# Patient Record
Sex: Female | Born: 1960 | Race: Black or African American | Hispanic: No | Marital: Married | Smoking: Former smoker
Health system: Southern US, Community
[De-identification: ages and names within clinical notes are randomized; demographics above are authoritative.]

---

## 2000-07-11 HISTORY — PX: BREAST BIOPSY: SHX20

## 2013-10-08 ENCOUNTER — Ambulatory Visit: Payer: Self-pay

## 2014-10-16 ENCOUNTER — Ambulatory Visit: Payer: Medicaid Other | Admitting: Orthopedic Surgery

## 2014-10-20 ENCOUNTER — Ambulatory Visit: Payer: Medicaid Other | Admitting: Orthopedic Surgery

## 2014-10-21 ENCOUNTER — Encounter: Payer: Self-pay | Admitting: Orthopedic Surgery

## 2014-12-15 ENCOUNTER — Ambulatory Visit: Payer: Medicaid Other | Admitting: Orthopedic Surgery

## 2015-10-29 DIAGNOSIS — A6000 Herpesviral infection of urogenital system, unspecified: Secondary | ICD-10-CM | POA: Insufficient documentation

## 2015-10-29 DIAGNOSIS — M549 Dorsalgia, unspecified: Secondary | ICD-10-CM | POA: Insufficient documentation

## 2015-10-29 DIAGNOSIS — I10 Essential (primary) hypertension: Secondary | ICD-10-CM | POA: Insufficient documentation

## 2015-11-02 ENCOUNTER — Other Ambulatory Visit: Payer: Self-pay | Admitting: Family Medicine

## 2015-11-02 DIAGNOSIS — M542 Cervicalgia: Secondary | ICD-10-CM

## 2015-11-02 DIAGNOSIS — Z1231 Encounter for screening mammogram for malignant neoplasm of breast: Secondary | ICD-10-CM

## 2015-11-02 DIAGNOSIS — M549 Dorsalgia, unspecified: Secondary | ICD-10-CM

## 2015-11-09 ENCOUNTER — Ambulatory Visit
Admission: RE | Admit: 2015-11-09 | Discharge: 2015-11-09 | Disposition: A | Discharge status: Home / Self Care | Payer: Medicaid Other | Source: Ambulatory Visit | Attending: Family Medicine | Admitting: Family Medicine

## 2015-11-09 DIAGNOSIS — Z1231 Encounter for screening mammogram for malignant neoplasm of breast: Secondary | ICD-10-CM | POA: Insufficient documentation

## 2015-11-10 ENCOUNTER — Ambulatory Visit
Admission: RE | Admit: 2015-11-10 | Discharge: 2015-11-10 | Disposition: A | Discharge status: Home / Self Care | Payer: Medicaid Other | Source: Ambulatory Visit | Attending: Family Medicine | Admitting: Family Medicine

## 2015-11-10 DIAGNOSIS — M542 Cervicalgia: Secondary | ICD-10-CM | POA: Insufficient documentation

## 2015-11-10 DIAGNOSIS — M549 Dorsalgia, unspecified: Secondary | ICD-10-CM | POA: Insufficient documentation

## 2015-11-26 DIAGNOSIS — J302 Other seasonal allergic rhinitis: Secondary | ICD-10-CM | POA: Insufficient documentation

## 2015-12-01 ENCOUNTER — Other Ambulatory Visit: Payer: Self-pay | Admitting: Family Medicine

## 2015-12-01 DIAGNOSIS — R928 Other abnormal and inconclusive findings on diagnostic imaging of breast: Secondary | ICD-10-CM

## 2015-12-11 ENCOUNTER — Ambulatory Visit: Payer: Medicaid Other

## 2015-12-11 ENCOUNTER — Other Ambulatory Visit: Payer: Medicaid Other

## 2015-12-16 ENCOUNTER — Ambulatory Visit: Payer: Medicaid Other | Attending: Family Medicine | Admitting: Physical Therapy

## 2015-12-16 ENCOUNTER — Encounter: Payer: Self-pay | Admitting: Physical Therapy

## 2015-12-16 DIAGNOSIS — M542 Cervicalgia: Secondary | ICD-10-CM | POA: Diagnosis present
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
## 2015-12-23 ENCOUNTER — Ambulatory Visit: Payer: Medicaid Other

## 2015-12-23 ENCOUNTER — Other Ambulatory Visit: Payer: Medicaid Other

## 2016-01-07 ENCOUNTER — Other Ambulatory Visit: Payer: Medicaid Other

## 2016-01-07 ENCOUNTER — Ambulatory Visit: Admission: RE | Admit: 2016-01-07 | Payer: Medicaid Other | Source: Ambulatory Visit

## 2016-01-22 ENCOUNTER — Ambulatory Visit: Payer: Medicaid Other | Admitting: Sports Medicine

## 2016-02-23 DIAGNOSIS — R32 Unspecified urinary incontinence: Secondary | ICD-10-CM | POA: Insufficient documentation

## 2016-11-17 ENCOUNTER — Encounter: Payer: Medicaid Other | Admitting: Podiatry

## 2016-11-17 NOTE — Progress Notes (Signed)
This encounter was created in error - please disregard.

## 2016-11-29 ENCOUNTER — Encounter: Payer: Medicaid Other | Admitting: Podiatry

## 2016-12-09 DIAGNOSIS — Z113 Encounter for screening for infections with a predominantly sexual mode of transmission: Secondary | ICD-10-CM | POA: Insufficient documentation

## 2017-01-16 NOTE — Progress Notes (Signed)
This encounter was created in error - please disregard.

## 2017-03-02 DIAGNOSIS — F439 Reaction to severe stress, unspecified: Secondary | ICD-10-CM | POA: Insufficient documentation

## 2017-03-29 ENCOUNTER — Other Ambulatory Visit: Payer: Self-pay | Admitting: Family Medicine

## 2017-03-29 DIAGNOSIS — Z1231 Encounter for screening mammogram for malignant neoplasm of breast: Secondary | ICD-10-CM

## 2017-03-29 DIAGNOSIS — R928 Other abnormal and inconclusive findings on diagnostic imaging of breast: Secondary | ICD-10-CM

## 2017-04-11 ENCOUNTER — Other Ambulatory Visit: Payer: Self-pay

## 2017-04-11 ENCOUNTER — Ambulatory Visit: Admission: RE | Admit: 2017-04-11 | Payer: Self-pay | Source: Ambulatory Visit

## 2017-04-11 ENCOUNTER — Ambulatory Visit: Payer: Self-pay

## 2017-05-02 ENCOUNTER — Ambulatory Visit: Payer: Medicaid Other

## 2017-05-31 ENCOUNTER — Ambulatory Visit: Payer: Medicaid Other

## 2017-05-31 ENCOUNTER — Ambulatory Visit
Admission: RE | Admit: 2017-05-31 | Discharge: 2017-05-31 | Disposition: A | Discharge status: Home / Self Care | Payer: Medicaid Other | Source: Ambulatory Visit | Attending: Family Medicine | Admitting: Family Medicine

## 2017-05-31 DIAGNOSIS — Z1231 Encounter for screening mammogram for malignant neoplasm of breast: Secondary | ICD-10-CM

## 2017-05-31 DIAGNOSIS — R928 Other abnormal and inconclusive findings on diagnostic imaging of breast: Secondary | ICD-10-CM

## 2017-06-09 ENCOUNTER — Other Ambulatory Visit: Payer: Medicaid Other

## 2017-07-14 ENCOUNTER — Other Ambulatory Visit: Payer: Medicaid Other

## 2017-08-22 ENCOUNTER — Other Ambulatory Visit: Payer: Self-pay | Admitting: Pediatrics

## 2017-08-22 DIAGNOSIS — J449 Chronic obstructive pulmonary disease, unspecified: Secondary | ICD-10-CM

## 2017-09-05 ENCOUNTER — Other Ambulatory Visit: Payer: Self-pay | Admitting: Pediatrics

## 2017-09-05 ENCOUNTER — Ambulatory Visit
Admission: RE | Admit: 2017-09-05 | Discharge: 2017-09-05 | Disposition: A | Discharge status: Home / Self Care | Payer: Disability Insurance | Source: Ambulatory Visit | Attending: Pediatrics | Admitting: Pediatrics

## 2017-09-05 ENCOUNTER — Ambulatory Visit (HOSPITAL_COMMUNITY): Payer: Disability Insurance

## 2017-09-05 DIAGNOSIS — I10 Essential (primary) hypertension: Secondary | ICD-10-CM | POA: Diagnosis present

## 2017-09-05 DIAGNOSIS — J449 Chronic obstructive pulmonary disease, unspecified: Secondary | ICD-10-CM | POA: Diagnosis not present

## 2017-09-05 DIAGNOSIS — M1712 Unilateral primary osteoarthritis, left knee: Secondary | ICD-10-CM | POA: Insufficient documentation

## 2017-09-05 DIAGNOSIS — M1990 Unspecified osteoarthritis, unspecified site: Secondary | ICD-10-CM

## 2017-09-29 ENCOUNTER — Ambulatory Visit
Admission: RE | Admit: 2017-09-29 | Discharge: 2017-09-29 | Disposition: A | Discharge status: Home / Self Care | Payer: Medicaid Other | Source: Ambulatory Visit | Attending: Family Medicine | Admitting: Family Medicine

## 2017-09-29 DIAGNOSIS — R928 Other abnormal and inconclusive findings on diagnostic imaging of breast: Secondary | ICD-10-CM

## 2017-09-29 DIAGNOSIS — Z1231 Encounter for screening mammogram for malignant neoplasm of breast: Secondary | ICD-10-CM

## 2018-04-02 DIAGNOSIS — Z91120 Patient's intentional underdosing of medication regimen due to financial hardship: Secondary | ICD-10-CM | POA: Insufficient documentation

## 2018-04-25 ENCOUNTER — Other Ambulatory Visit: Payer: Self-pay | Admitting: Family Medicine

## 2018-04-25 DIAGNOSIS — R928 Other abnormal and inconclusive findings on diagnostic imaging of breast: Secondary | ICD-10-CM

## 2018-05-07 ENCOUNTER — Other Ambulatory Visit: Payer: Medicaid Other

## 2018-05-14 ENCOUNTER — Ambulatory Visit
Admission: RE | Admit: 2018-05-14 | Discharge: 2018-05-14 | Disposition: A | Discharge status: Home / Self Care | Payer: Medicaid Other | Source: Ambulatory Visit | Attending: Family Medicine | Admitting: Family Medicine

## 2018-05-14 ENCOUNTER — Encounter: Payer: Self-pay | Admitting: Radiology

## 2018-05-14 DIAGNOSIS — R928 Other abnormal and inconclusive findings on diagnostic imaging of breast: Secondary | ICD-10-CM

## 2018-06-06 DIAGNOSIS — Z0000 Encounter for general adult medical examination without abnormal findings: Secondary | ICD-10-CM | POA: Insufficient documentation

## 2018-08-08 DIAGNOSIS — N3946 Mixed incontinence: Secondary | ICD-10-CM | POA: Insufficient documentation

## 2018-08-10 ENCOUNTER — Other Ambulatory Visit: Payer: Self-pay | Admitting: Family Medicine

## 2018-08-10 DIAGNOSIS — R928 Other abnormal and inconclusive findings on diagnostic imaging of breast: Secondary | ICD-10-CM

## 2018-10-01 ENCOUNTER — Other Ambulatory Visit: Payer: Medicaid Other

## 2018-10-04 ENCOUNTER — Other Ambulatory Visit: Payer: Medicaid Other

## 2018-11-12 DIAGNOSIS — Z87891 Personal history of nicotine dependence: Secondary | ICD-10-CM | POA: Insufficient documentation

## 2018-11-12 DIAGNOSIS — Z9181 History of falling: Secondary | ICD-10-CM | POA: Insufficient documentation

## 2018-12-05 DIAGNOSIS — R928 Other abnormal and inconclusive findings on diagnostic imaging of breast: Secondary | ICD-10-CM | POA: Insufficient documentation

## 2018-12-05 DIAGNOSIS — M25569 Pain in unspecified knee: Secondary | ICD-10-CM | POA: Insufficient documentation

## 2019-01-03 DIAGNOSIS — L0233 Carbuncle of buttock: Secondary | ICD-10-CM | POA: Insufficient documentation

## 2019-01-22 DIAGNOSIS — L729 Follicular cyst of the skin and subcutaneous tissue, unspecified: Secondary | ICD-10-CM | POA: Insufficient documentation

## 2019-05-01 DIAGNOSIS — L723 Sebaceous cyst: Secondary | ICD-10-CM | POA: Insufficient documentation

## 2019-05-06 ENCOUNTER — Other Ambulatory Visit: Payer: Self-pay | Admitting: Internal Medicine

## 2019-05-06 ENCOUNTER — Ambulatory Visit
Admission: RE | Admit: 2019-05-06 | Discharge: 2019-05-06 | Disposition: A | Discharge status: Home / Self Care | Payer: Disability Insurance | Attending: Internal Medicine | Admitting: Internal Medicine

## 2019-05-06 ENCOUNTER — Ambulatory Visit
Admission: RE | Admit: 2019-05-06 | Discharge: 2019-05-06 | Disposition: A | Discharge status: Home / Self Care | Payer: Disability Insurance | Source: Ambulatory Visit | Attending: Internal Medicine | Admitting: Internal Medicine

## 2019-05-06 DIAGNOSIS — M1712 Unilateral primary osteoarthritis, left knee: Secondary | ICD-10-CM | POA: Diagnosis present

## 2019-05-06 DIAGNOSIS — M1711 Unilateral primary osteoarthritis, right knee: Secondary | ICD-10-CM | POA: Diagnosis present

## 2019-08-22 DIAGNOSIS — R519 Headache, unspecified: Secondary | ICD-10-CM | POA: Insufficient documentation

## 2019-08-22 DIAGNOSIS — M7918 Myalgia, other site: Secondary | ICD-10-CM | POA: Insufficient documentation

## 2019-09-06 DIAGNOSIS — W19XXXA Unspecified fall, initial encounter: Secondary | ICD-10-CM | POA: Insufficient documentation

## 2019-09-23 ENCOUNTER — Other Ambulatory Visit: Payer: Self-pay | Admitting: Family Medicine

## 2019-09-23 DIAGNOSIS — Z1231 Encounter for screening mammogram for malignant neoplasm of breast: Secondary | ICD-10-CM

## 2019-10-25 DIAGNOSIS — R599 Enlarged lymph nodes, unspecified: Secondary | ICD-10-CM | POA: Insufficient documentation

## 2019-11-06 DIAGNOSIS — M25511 Pain in right shoulder: Secondary | ICD-10-CM | POA: Insufficient documentation

## 2019-11-12 ENCOUNTER — Other Ambulatory Visit: Payer: Self-pay | Admitting: Family Medicine

## 2019-11-12 DIAGNOSIS — S060X0A Concussion without loss of consciousness, initial encounter: Secondary | ICD-10-CM

## 2019-11-21 ENCOUNTER — Ambulatory Visit
Admission: RE | Admit: 2019-11-21 | Discharge: 2019-11-21 | Disposition: A | Discharge status: Home / Self Care | Payer: Medicaid Other | Source: Ambulatory Visit | Attending: Family Medicine | Admitting: Family Medicine

## 2019-11-21 ENCOUNTER — Other Ambulatory Visit: Payer: Self-pay

## 2019-11-21 DIAGNOSIS — S060X0A Concussion without loss of consciousness, initial encounter: Secondary | ICD-10-CM | POA: Insufficient documentation

## 2019-11-25 ENCOUNTER — Other Ambulatory Visit: Payer: Self-pay

## 2019-11-25 ENCOUNTER — Encounter: Payer: Self-pay | Admitting: Urology

## 2019-11-25 ENCOUNTER — Ambulatory Visit (INDEPENDENT_AMBULATORY_CARE_PROVIDER_SITE_OTHER): Payer: Medicaid Other | Admitting: Urology

## 2019-11-25 VITALS — BP 168/85 | HR 88 | Ht 62.0 in | Wt 163.0 lb

## 2019-11-25 DIAGNOSIS — N3946 Mixed incontinence: Secondary | ICD-10-CM | POA: Diagnosis not present

## 2019-11-25 DIAGNOSIS — R32 Unspecified urinary incontinence: Secondary | ICD-10-CM | POA: Diagnosis not present

## 2019-11-25 LAB — URINALYSIS, COMPLETE
Bilirubin, UA: NEGATIVE
Glucose, UA: NEGATIVE
Ketones, UA: NEGATIVE
Leukocytes,UA: NEGATIVE
Nitrite, UA: NEGATIVE
Protein,UA: NEGATIVE
RBC, UA: NEGATIVE
Specific Gravity, UA: >1.03 — ABNORMAL HIGH (ref 1.005–1.030)
Urobilinogen, Ur: 0.2 mg/dL (ref 0.2–1.0)
pH, UA: 5 (ref 5.0–7.5)

## 2019-11-25 LAB — MICROSCOPIC EXAMINATION: Epithelial Cells (non renal): >10 /hpf — AB (ref 0–10)

## 2019-11-25 LAB — BLADDER SCAN AMB NON-IMAGING: Scan Result: 20

## 2019-11-25 MED ORDER — MIRABEGRON ER 50 MG PO TB24: 50.0000 mg | ORAL_TABLET | Freq: Every day | ORAL | 11 refills | Status: AC

## 2019-11-25 NOTE — Progress Notes (Signed)
11/25/2019 10:28 AM   Lacie Scotts Aug 22, 1960 [id number]  Referring provider: Emogene Morgan, MD [address] [place],  [place] [address]  Chief Complaint  Patient presents with  . Urinary Incontinence    HPI: I was consulted to assess the patient's urinary incontinence.  Some of the details were a little bit challenging but she appears to be voiding approximately every 10 minutes due to urgency.  She voids small amounts frequently especially after drinking soda.  She has urge incontinence.  No bedwetting.  Might leak with bending lifting but not coughing sneezing.  She says she uses 12 pads a day moderately wet.  Things have been going on for years.  Generally gets up 4 times at night to urinate  Has had a hysterectomy I believe  No history of kidney stones previous GU surgery or bladder infections.  On pelvic examination patient had excellent support.  No stress incontinence or cystocele   PMH: No past medical history on file.  Surgical History: Past Surgical History:  Procedure Laterality Date  . BREAST BIOPSY Right 2002   neg    Home Medications:  Allergies as of 11/25/2019   Not on File     Medication List       Accurate as of Nov 25, 2019 10:28 AM. If you have any questions, ask your nurse or doctor.        amLODipine-benazepril 5-20 MG capsule Commonly known as: LOTREL amlodipine 5 mg-benazepril 20 mg capsule  TAKE 2 CAPSULES BY MOUTH AT THE SAME TIME EVERY DAY   aspirin 81 MG EC tablet aspirin 81 mg tablet,delayed release  TAKE 1 TABLET BY MOUTH DAILY FOR HEART PROTECTION   cephALEXin 500 MG capsule Commonly known as: KEFLEX cephalexin 500 mg capsule  TAKE 1 CAPSULE BY MOUTH 3 TIMES A DAY FOR 10 DAYS FOR INFECTION   Cetirizine HCl 10 MG Caps Take by mouth.   naproxen 500 MG tablet Commonly known as: NAPROSYN naproxen 500 mg tablet  TAKE 1 TABLET BY MOUTH TWICE A DAY WITH MEALS       Allergies: Not on File  Family  History: Family History  Problem Relation Age of Onset  . Breast cancer Neg Hx     Social History:  reports that she quit smoking about 4 years ago. She has a 5.00 pack-year smoking history. She has never used smokeless tobacco. She reports previous alcohol use. No history on file for drug.  ROS:                                        Physical Exam: BP (!) 168/85   Pulse 88   Ht 5\' 2"  (1.575 m)   Wt 163 lb (73.9 kg)   LMP 09/12/2017   BMI 29.81 kg/m   Constitutional:  Alert and oriented, No acute distress.  Laboratory Data: No results found for: WBC, HGB, HCT, MCV, PLT  No results found for: CREATININE  No results found for: PSA  No results found for: TESTOSTERONE  No results found for: HGBA1C  Urinalysis No results found for: COLORURINE, APPEARANCEUR, LABSPEC, PHURINE, GLUCOSEU, HGBUR, BILIRUBINUR, KETONESUR, PROTEINUR, UROBILINOGEN, NITRITE, LEUKOCYTESUR  Pertinent Imaging:  Urinalysis abnormal.  Culture sent.  Chart reviewed.  Prescription given   Assessment & Plan: Patient has mixed incontinence but primarily urge incontinence and small-volume frequency that is impressive.  I will start her on  medication have her come back for pelvic examination and cystoscopy.  I will call if urine culture is positive  Reassess on Myrbetriq 50 mg samples and prescription in 6 weeks  1. Urinary incontinence, unspecified type  - Urinalysis, Complete - Bladder Scan (Post Void Residual) in office   No follow-ups on file.  Reece Packer, MD  [place] [address], [place],  [address] [phone]

## 2019-11-25 NOTE — Patient Instructions (Signed)
Cystoscopy Cystoscopy is a procedure that is used to help diagnose and sometimes treat conditions that affect the lower urinary tract. The lower urinary tract includes the bladder and the urethra. The urethra is the tube that drains urine from the bladder. Cystoscopy is done using a thin, tube-shaped instrument with a light and camera at the end (cystoscope). The cystoscope may be hard or flexible, depending on the goal of the procedure. The cystoscope is inserted through the urethra, into the bladder. Cystoscopy may be recommended if you have:  Urinary tract infections that keep coming back.  Blood in the urine (hematuria).  An inability to control when you urinate (urinary incontinence) or an overactive bladder.  Unusual cells found in a urine sample.  A blockage in the urethra, such as a urinary stone.  Painful urination.  An abnormality in the bladder found during an intravenous pyelogram (IVP) or CT scan. Cystoscopy may also be done to remove a sample of tissue to be examined under a microscope (biopsy). What are the risks? Generally, this is a safe procedure. However, problems may occur, including:  Infection.  Bleeding.  What happens during the procedure?  1. You will be given one or more of the following: ? A medicine to numb the area (local anesthetic). 2. The area around the opening of your urethra will be cleaned. 3. The cystoscope will be passed through your urethra into your bladder. 4. Germ-free (sterile) fluid will flow through the cystoscope to fill your bladder. The fluid will stretch your bladder so that your health care provider can clearly examine your bladder walls. 5. Your doctor will look at the urethra and bladder. 6. The cystoscope will be removed The procedure may vary among health care providers  What can I expect after the procedure? After the procedure, it is common to have: 1. Some soreness or pain in your abdomen and urethra. 2. Urinary symptoms.  These include: ? Mild pain or burning when you urinate. Pain should stop within a few minutes after you urinate. This may last for up to 1 week. ? A small amount of blood in your urine for several days. ? Feeling like you need to urinate but producing only a small amount of urine. Follow these instructions at home: General instructions  Return to your normal activities as told by your health care provider.   Do not drive for 24 hours if you were given a sedative during your procedure.  Watch for any blood in your urine. If the amount of blood in your urine increases, call your health care provider.  If a tissue sample was removed for testing (biopsy) during your procedure, it is up to you to get your test results. Ask your health care provider, or the department that is doing the test, when your results will be ready.  Drink enough fluid to keep your urine pale yellow.  Keep all follow-up visits as told by your health care provider. This is important. Contact a health care provider if you:  Have pain that gets worse or does not get better with medicine, especially pain when you urinate.  Have trouble urinating.  Have more blood in your urine. Get help right away if you:  Have blood clots in your urine.  Have abdominal pain.  Have a fever or chills.  Are unable to urinate. Summary  Cystoscopy is a procedure that is used to help diagnose and sometimes treat conditions that affect the lower urinary tract.  Cystoscopy is done using   a thin, tube-shaped instrument with a light and camera at the end.  After the procedure, it is common to have some soreness or pain in your abdomen and urethra.  Watch for any blood in your urine. If the amount of blood in your urine increases, call your health care provider.  If you were prescribed an antibiotic medicine, take it as told by your health care provider. Do not stop taking the antibiotic even if you start to feel better. This  information is not intended to replace advice given to you by your health care provider. Make sure you discuss any questions you have with your health care provider. Document Revised: 06/19/2018 Document Reviewed: 06/19/2018 Elsevier Patient Education  2020 Elsevier Inc[employer]   

## 2019-11-27 LAB — CULTURE, URINE COMPREHENSIVE

## 2020-01-06 ENCOUNTER — Other Ambulatory Visit: Payer: Self-pay | Admitting: Urology

## 2020-02-13 DIAGNOSIS — N760 Acute vaginitis: Secondary | ICD-10-CM | POA: Insufficient documentation

## 2020-05-06 DIAGNOSIS — M5417 Radiculopathy, lumbosacral region: Secondary | ICD-10-CM | POA: Insufficient documentation

## 2020-05-25 ENCOUNTER — Other Ambulatory Visit: Payer: Self-pay

## 2020-05-25 ENCOUNTER — Encounter: Payer: Self-pay | Admitting: Emergency Medicine

## 2020-05-25 ENCOUNTER — Emergency Department
Admission: EM | Admit: 2020-05-25 | Discharge: 2020-05-25 | Disposition: A | Discharge status: Home / Self Care | Payer: Medicaid Other | Attending: Emergency Medicine | Admitting: Emergency Medicine

## 2020-05-25 DIAGNOSIS — Y93G1 Activity, food preparation and clean up: Secondary | ICD-10-CM | POA: Insufficient documentation

## 2020-05-25 DIAGNOSIS — Y990 Civilian activity done for income or pay: Secondary | ICD-10-CM | POA: Diagnosis not present

## 2020-05-25 DIAGNOSIS — Z79899 Other long term (current) drug therapy: Secondary | ICD-10-CM | POA: Diagnosis not present

## 2020-05-25 DIAGNOSIS — S61211A Laceration without foreign body of left index finger without damage to nail, initial encounter: Secondary | ICD-10-CM | POA: Diagnosis present

## 2020-05-25 DIAGNOSIS — Y929 Unspecified place or not applicable: Secondary | ICD-10-CM | POA: Insufficient documentation

## 2020-05-25 DIAGNOSIS — Z7982 Long term (current) use of aspirin: Secondary | ICD-10-CM | POA: Diagnosis not present

## 2020-05-25 DIAGNOSIS — Z87891 Personal history of nicotine dependence: Secondary | ICD-10-CM | POA: Diagnosis not present

## 2020-05-25 DIAGNOSIS — S61411A Laceration without foreign body of right hand, initial encounter: Secondary | ICD-10-CM

## 2020-05-25 DIAGNOSIS — W25XXXA Contact with sharp glass, initial encounter: Secondary | ICD-10-CM | POA: Diagnosis not present

## 2020-05-25 DIAGNOSIS — Z23 Encounter for immunization: Secondary | ICD-10-CM | POA: Insufficient documentation

## 2020-05-25 MED ORDER — TETANUS-DIPHTH-ACELL PERTUSSIS 5-2.5-18.5 LF-MCG/0.5 IM SUSY
0.5000 mL | PREFILLED_SYRINGE | Freq: Once | INTRAMUSCULAR | Status: AC
  Administered 2020-05-25: 0.5 mL via INTRAMUSCULAR
  Filled 2020-05-25: qty 0.5

## 2020-05-25 MED ORDER — TETRACAINE HCL 0.5 % OP SOLN
2.0000 [drp] | Freq: Once | OPHTHALMIC | Status: AC
  Administered 2020-05-25: 2 [drp] via OPHTHALMIC
  Filled 2020-05-25: qty 4

## 2020-05-25 MED ORDER — LIDOCAINE HCL URETHRAL/MUCOSAL 2 % EX GEL
1.0000 "application " | Freq: Once | CUTANEOUS | Status: AC
  Administered 2020-05-25: 1 via TOPICAL
  Filled 2020-05-25: qty 5

## 2020-05-25 MED ORDER — CEPHALEXIN 500 MG PO CAPS
500.0000 mg | ORAL_CAPSULE | Freq: Once | ORAL | Status: AC
  Administered 2020-05-25: 500 mg via ORAL
  Filled 2020-05-25: qty 1

## 2020-05-25 MED ORDER — CEPHALEXIN 500 MG PO CAPS: 500.0000 mg | ORAL_CAPSULE | Freq: Three times a day (TID) | ORAL | 0 refills | Status: AC

## 2020-05-25 NOTE — ED Provider Notes (Signed)
[facility] Emergency Department Provider Note ____________________________________________  Time seen: 1159  I have reviewed the triage vital signs and the nursing notes.  HISTORY  Chief Complaint  Laceration   HPI Nancy Chase is a 59 y.o. right-handed female presents to the ED for evaluation of 's and a laceration to the right hand.  Injury occurred last night about 8:00 while at work.  She was washing dishes, when she excellently cut her hand on a piece of broken drinking glass.  Did not present to the ED last night for fear of long, protracted weight.  She presents today for evaluation and management of her open wound.  The laceration is to the lateral knuckle of the right index finger.  Patient denies any other injury at this time but she is unclear of her current tetanus status.  History reviewed. No pertinent past medical history.  There are no problems to display for this patient.   Past Surgical History:  Procedure Laterality Date  . BREAST BIOPSY Right 2002   neg    Prior to Admission medications   Medication Sig Start Date End Date Taking? Authorizing Provider  amLODipine-benazepril (LOTREL) 5-20 MG capsule amlodipine 5 mg-benazepril 20 mg capsule  TAKE 2 CAPSULES BY MOUTH AT THE SAME TIME EVERY DAY    [provider]  aspirin 81 MG EC tablet aspirin 81 mg tablet,delayed release  TAKE 1 TABLET BY MOUTH DAILY FOR HEART PROTECTION    [provider]  cephALEXin (KEFLEX) 500 MG capsule Take 1 capsule (500 mg total) by mouth 3 (three) times daily for 7 days. 05/25/20 06/01/20  Kaneesha Constantino, Charlesetta Ivory, PA-C  Cetirizine HCl 10 MG CAPS Take by mouth.    [provider]  mirabegron ER (MYRBETRIQ) 50 MG TB24 tablet Take 1 tablet (50 mg total) by mouth daily. 11/25/19   MacDiarmid, Lorin Picket, MD  naproxen (NAPROSYN) 500 MG tablet naproxen 500 mg tablet  TAKE 1 TABLET BY MOUTH TWICE A DAY WITH MEALS 07/30/19   [provider]     Allergies Patient has no known allergies.  Family History  Problem Relation Age of Onset  . Breast cancer Neg Hx     Social History Social History   Tobacco Use  . Smoking status: Former Smoker    Packs/day: 0.25    Years: 20.00    Pack years: 5.00    Quit date: 12/16/2014    Years since quitting: 5.4  . Smokeless tobacco: Never Used  Substance Use Topics  . Alcohol use: Not Currently  . Drug use: Not on file    Review of Systems  Constitutional: Negative for fever. Cardiovascular: Negative for chest pain. Respiratory: Negative for shortness of breath. Gastrointestinal: Negative for abdominal pain, vomiting and diarrhea. Genitourinary: Negative for dysuria. Musculoskeletal: Negative for back pain.  Right hand laceration as above. Skin: Negative for rash. Neurological: Negative for headaches, focal weakness or numbness. ____________________________________________  PHYSICAL EXAM:  VITAL SIGNS: ED Triage Vitals  Enc Vitals Group     BP 05/25/20 1020 (!) 176/79     Pulse Rate 05/25/20 1020 63     Resp 05/25/20 1020 16     Temp 05/25/20 1020 98.7 F (37.1 C)     Temp Source 05/25/20 1020 Oral     SpO2 05/25/20 1020 100 %     Weight 05/25/20 1006 153 lb (69.4 kg)     Height 05/25/20 1006 5\' 1"  (1.549 m)     Head Circumference --  Peak Flow --      Pain Score 05/25/20 1006 7     Pain Loc --      Pain Edu? --      Excl. in GC? --     Constitutional: Alert and oriented. Well appearing and in no distress. Head: Normocephalic and atraumatic. Eyes: Conjunctivae are normal. Normal extraocular movements Cardiovascular: Normal rate, regular rhythm. Normal distal pulses. Respiratory: Normal respiratory effort. No wheezes/rales/rhonchi. Musculoskeletal: Normal composite fist on the right.  Patient with a 2 cm laceration over the lateral index finger MCP.  She is able to demonstrate normal flexion extension range.  The wound appears somewhat macerated with no  wound edge approximation at this time.  Tenderness to palpation over the joint is also appreciated.  Nontender with normal range of motion in all extremities.  Neurologic:  Normal gross sensation.  Normal intrinsic and opposition testing noted.  Normal speech and language. No gross focal neurologic deficits are appreciated. Skin:  Skin is warm, dry and intact. No rash noted. ____________________________________________  PROCEDURES  Tdap 0.5 ml IM Keflex 500 mg PO  .Marland KitchenLaceration Repair  Date/Time: 05/25/2020 12:58 PM Performed by: Lissa Hoard, PA-C Authorized by: Lissa Hoard, PA-C   Consent:    Consent obtained:  Verbal   Consent given by:  Patient   Risks discussed:  Pain and poor wound healing   Alternatives discussed:  No treatment Anesthesia (see MAR for exact dosages):    Anesthesia method:  Topical application   Topical anesthetic:  Lidocaine gel and tetracaine gel Laceration details:    Location:  Finger   Finger location:  R index finger   Length (cm):  2   Depth (mm):  4 Repair type:    Repair type:  Simple Treatment:    Area cleansed with:  Soap and water   Amount of cleaning:  Standard Skin repair:    Repair method:  Steri-Strips   Number of Steri-Strips:  4 Approximation:    Approximation:  Loose Post-procedure details:    Dressing:  Non-adherent dressing and splint for protection   Patient tolerance of procedure:  Tolerated well, no immediate complications   ____________________________________________  INITIAL IMPRESSION / ASSESSMENT AND PLAN / ED COURSE  Patient presents to the ED for evaluation management of a laceration to the left index finger knuckle.  Injury occurred last night about 8 pm.At time of presentation, patient was approximately 16 hours status post injury.  We discussed the increased risk of infection with subsequent suture repair.  Patient was understandable and the wound was cleansed thoroughly with soap and water  after anesthesia was achieved using topical tetracaine and lidocaine gel.  The wound edges were coapted using Steri-Strips and the patient tolerated procedure well.  She was discharged with wound care instructions and supplies.  She will keep the wound clean, dry, and covered.  A work note is provided for 2 days as requested.  Chaniya Genter was evaluated in Emergency Department on 05/25/2020 for the symptoms described in the history of present illness. She was evaluated in the context of the global COVID-19 pandemic, which necessitated consideration that the patient might be at risk for infection with the SARS-CoV-2 virus that causes COVID-19. Institutional protocols and algorithms that pertain to the evaluation of patients at risk for COVID-19 are in a state of rapid change based on information released by regulatory bodies including the CDC and federal and state organizations. These policies and algorithms were followed during the patient's  care in the ED. ____________________________________________  FINAL CLINICAL IMPRESSION(S) / ED DIAGNOSES  Final diagnoses:  Laceration of right hand without foreign body, initial encounter      Lissa Hoard, PA-C 05/25/20 1327    Minna Antis, MD 05/25/20 1516

## 2020-05-25 NOTE — ED Triage Notes (Signed)
Pt to ER states she cut right forefinger on a broken glass last night.  States was told at work she needed to come have it evaluated.  Minor amount of bleeding noted at this time.

## 2020-05-25 NOTE — Discharge Instructions (Signed)
Your wound was cleansed and closed with Steri-Strips.  Sutures could not be used because of the delay in presenting to the ED for wound care.  Keep the wound clean, dry, and covered.  Follow-up with your primary provider for wound care management.  Take antibiotic as prescribed.

## 2020-10-12 DIAGNOSIS — M79644 Pain in right finger(s): Secondary | ICD-10-CM | POA: Insufficient documentation

## 2020-10-28 ENCOUNTER — Ambulatory Visit
Admission: RE | Admit: 2020-10-28 | Discharge: 2020-10-28 | Disposition: A | Discharge status: Home / Self Care | Payer: Medicaid Other | Attending: Family Medicine | Admitting: Family Medicine

## 2020-10-28 ENCOUNTER — Ambulatory Visit
Admission: RE | Admit: 2020-10-28 | Discharge: 2020-10-28 | Disposition: A | Discharge status: Home / Self Care | Payer: Medicaid Other | Source: Ambulatory Visit | Attending: Family Medicine | Admitting: Family Medicine

## 2020-10-28 ENCOUNTER — Other Ambulatory Visit: Payer: Self-pay | Admitting: Family Medicine

## 2020-10-28 DIAGNOSIS — M5416 Radiculopathy, lumbar region: Secondary | ICD-10-CM

## 2020-11-11 ENCOUNTER — Other Ambulatory Visit: Payer: Self-pay | Admitting: Family Medicine

## 2020-11-11 DIAGNOSIS — R928 Other abnormal and inconclusive findings on diagnostic imaging of breast: Secondary | ICD-10-CM

## 2020-11-19 ENCOUNTER — Other Ambulatory Visit: Payer: Self-pay

## 2020-11-19 ENCOUNTER — Ambulatory Visit
Admission: RE | Admit: 2020-11-19 | Discharge: 2020-11-19 | Disposition: A | Discharge status: Home / Self Care | Payer: Medicaid Other | Source: Ambulatory Visit | Attending: Family Medicine | Admitting: Family Medicine

## 2020-11-19 DIAGNOSIS — R928 Other abnormal and inconclusive findings on diagnostic imaging of breast: Secondary | ICD-10-CM | POA: Insufficient documentation

## 2020-12-21 ENCOUNTER — Ambulatory Visit: Payer: Medicaid Other | Attending: Orthopedic Surgery | Admitting: Occupational Therapy

## 2020-12-21 ENCOUNTER — Encounter: Payer: Self-pay | Admitting: Occupational Therapy

## 2020-12-21 ENCOUNTER — Other Ambulatory Visit: Payer: Self-pay

## 2020-12-21 DIAGNOSIS — M6281 Muscle weakness (generalized): Secondary | ICD-10-CM | POA: Insufficient documentation

## 2020-12-21 DIAGNOSIS — L905 Scar conditions and fibrosis of skin: Secondary | ICD-10-CM | POA: Diagnosis present

## 2020-12-21 DIAGNOSIS — M79641 Pain in right hand: Secondary | ICD-10-CM | POA: Insufficient documentation

## 2020-12-21 DIAGNOSIS — M25641 Stiffness of right hand, not elsewhere classified: Secondary | ICD-10-CM

## 2020-12-21 NOTE — Therapy (Signed)
[facility] PHYSICAL AND SPORTS MEDICINE [address] [place], [address] Phone: [phone]   Fax:  [phone]  Occupational Therapy Evaluation  Patient Details  Name: Nancy Chase MRN: [id number] Date of Birth: [birth year] Referring Provider (OT): Baldwin Jamaica   Encounter Date: 12/21/2020   OT End of Session - 12/21/20 1839     Visit Number 1    Number of Visits 6    Date for OT Re-Evaluation 02/01/21    OT Start Time 1523    OT Stop Time 1602    OT Time Calculation (min) 39 min    Activity Tolerance Patient tolerated treatment well    Behavior During Therapy [facility] for tasks assessed/performed             History reviewed. No pertinent past medical history.  Past Surgical History:  Procedure Laterality Date   BREAST BIOPSY Right 2002   neg    There were no vitals filed for this visit.   Subjective Assessment - 12/21/20 1826     Subjective  I cut my hand on glass in Nov 21 -and since then I could not do my job, I cannot grip anything, hold, open, pinch - anything that touch my finger or scar - some numbness on the side of my finger- also pain with bending my index - hurts so bad - pain is 10/10 with touching the scar and bending my finger - like  making fist or pinching    Pertinent History Pt seen by PA at [facility]  for right index finger, patient states that a piece of glass got into the finger when she cut the finger in November 2021. Per the patient, she was seen in the emergency department. She states the finger is still swollen and hurts over the MCP joint and proximal phalanx. She describes a pulling sensation in the finger. She states she has trouble opening a bottle of pills or using the hand as a result.     She is not currently working. Refer to hand therapy /OT    Patient Stated Goals I want to be able to bend my index finger to grip and pinch things to cut food, hold glass, use jar opener , open door, or bottles     Currently in Pain? Yes    Pain Score 10-Worst pain ever    Pain Location Finger (Comment which one)    Pain Orientation Right    Pain Descriptors / Indicators Aching;Burning;Numbness;Sharp;Tightness    Pain Type Acute pain    Pain Onset More than a month ago    Pain Frequency Intermittent    Aggravating Factors  touching , bending digit               [facility] OT Assessment - 12/21/20 0001       Assessment   Medical Diagnosis Laceration radial side of 2nd [facility]    Referring Provider (OT) Baldwin Jamaica    Onset Date/Surgical Date 05/11/20    Hand Dominance Right      Home  Environment   Lives With Daughter      Prior Function   Vocation Unemployed    Leisure Watch tv, grandkids      Strength   Right Hand Grip (lbs) 15    Right Hand Lateral Pinch 4 lbs    Right Hand 3 Point Pinch 5 lbs    Left Hand Grip (lbs) 35    Left Hand Lateral Pinch 16 lbs  Left Hand 3 Point Pinch 13 lbs      Right Hand AROM   R Thumb Opposition to Index --   pain to 2nd digit   R Index  MCP 0-90 60 Degrees    R Index PIP 0-100 65 Degrees   -15   R Index DIP 0-70 30 Degrees                      OT Treatments/Exercises (OP) - 12/21/20 0001       RUE Fluidotherapy   Number Minutes Fluidotherapy 8 Minutes    RUE Fluidotherapy Location Hand    Comments AROM in all planes for digits prior to review of HEP            Review of HEP - pt to use heat prior  Scar massage ed on and cica scar pad for night time  Several times during day - do massage, soft and rougher textures,   Tendon glides- tapping of digits for extention  Then tendon glides - 10 reps Opposition to all digits - 10 reps Thumb RA - palm together - can do AAROM         OT Education - 12/21/20 1839     Education Details findings of eval and HEP    Person(s) Educated Patient    Methods Explanation;Demonstration;Tactile cues;Verbal cues;Handout    Comprehension Verbal cues required;Returned  demonstration;Verbalized understanding              OT Short Term Goals - 12/21/20 1846       OT SHORT TERM GOAL #1   Title Pt to be independent in HEP to decrease scar adhesion , pain and hyper senstitivy over scar to show increase AROM in 2nd digit    Baseline hyper sensitivity to massage , rougher textures - decrease MC 60, PIP 65 DIP 30 -  pain with scar massage and AROM 10/10    Time 3    Period Weeks    Status New    Target Date 01/11/21               OT Long Term Goals - 12/21/20 1847       OT LONG TERM GOAL #1   Title Pt R 2nd digit AROM increase to WNL to touch palm and full extention to use hand in hold glass, push button and cut with knife    Baseline Pain 10/10 with AROM fisting , extention -and pinch or grip-  MC 60,PIP 65, DIP 30    Time 6    Period Weeks    Status New    Target Date 02/01/21      OT LONG TERM GOAL #2   Title R grip and prehension increase to more than 50% to use hand in open jar, pill bottle, squeeze washcloth , hold glass    Baseline Grip R 15, L 35 , Lat grip R 4, L 16; 3 point grip L 13, R 5 lbs    Time 6    Period Weeks    Status New    Target Date 02/01/21      OT LONG TERM GOAL #3   Title Scar tissue on R hand to improve for pt to tolerate texturs, flexion, extention of 2nd digit- tapping and vibration tools    Baseline scar adhere and lmting motion - 10/10 pain with scar massage and  drying with towel , and motion    Time 6  Period Weeks    Status New    Target Date 02/01/21                   Plan - 12/21/20 1840     Clinical Impression Statement Pt present at OT eval 7 months out from laceration to R radial 2nd MC - scar tissue thick and adhere - tender and hyper sensitive to touch, massage ,rougher texture. Report numbness on radial side of 2nd digit,  Scar adhesion limiting her extention and flexion of 2nd digit- and increase pain with flexion , extention. Pt show grip and prehension decrease significantly.  All limiting her use of R hand greatly in ADL's and IADL's- pt did  show increase AROM after fluido -and review of HEP - pt ed on desentitization, HEP , scar management - Pt can beneift fomr skilled OT services    OT Occupational Profile and History Problem Focused Assessment - Including review of records relating to presenting problem    Occupational performance deficits (Please refer to evaluation for details): ADL's;IADL's;Work;Play;Leisure    Body Structure / Function / Physical Skills ADL;Coordination;FMC;Sensation;Flexibility;Scar mobility;ROM;IADL;UE functional use;Pain;Strength;Dexterity    Rehab Potential Good    Clinical Decision Making Limited treatment options, no task modification necessary    Comorbidities Affecting Occupational Performance: None    Modification or Assistance to Complete Evaluation  No modification of tasks or assist necessary to complete eval    OT Frequency 1x / week    OT Treatment/Interventions Self-care/ADL training;Paraffin;Manual Therapy;Patient/family education;Scar mobilization;Therapeutic exercise;Ultrasound;Fluidtherapy;Splinting    Consulted and Agree with Plan of Care Patient             Patient will benefit from skilled therapeutic intervention in order to improve the following deficits and impairments:   Body Structure / Function / Physical Skills: ADL, Coordination, FMC, Sensation, Flexibility, Scar mobility, ROM, IADL, UE functional use, Pain, Strength, Dexterity       Visit Diagnosis: Scar condition and fibrosis of skin - Plan: Ot plan of care cert/re-cert  Pain in right hand - Plan: Ot plan of care cert/re-cert  Stiffness of right hand, not elsewhere classified - Plan: Ot plan of care cert/re-cert  Muscle weakness (generalized) - Plan: Ot plan of care cert/re-cert    Problem List There are no problems to display for this patient.   Oletta Cohn OTR/L,CLT 12/21/2020, 6:55 PM  [facility]  PHYSICAL AND SPORTS MEDICINE [address], [place], [address] Phone: [phone]   Fax:  [phone]  Name: Nancy Chase MRN: [id number] Date of Birth: 1961-04-13

## 2020-12-29 ENCOUNTER — Ambulatory Visit: Payer: Medicaid Other | Admitting: Occupational Therapy

## 2021-01-04 ENCOUNTER — Ambulatory Visit: Payer: Medicaid Other | Admitting: Occupational Therapy

## 2021-01-12 ENCOUNTER — Ambulatory Visit: Payer: Medicaid Other | Admitting: Occupational Therapy

## 2021-01-14 ENCOUNTER — Ambulatory Visit: Payer: Medicaid Other | Admitting: Occupational Therapy

## 2021-01-25 ENCOUNTER — Ambulatory Visit: Payer: Medicaid Other | Admitting: Cardiology

## 2021-01-26 ENCOUNTER — Encounter: Payer: Self-pay | Admitting: Cardiology

## 2021-07-08 IMAGING — CR DG LUMBAR SPINE COMPLETE 4+V
1 series · 5 of 5 positions shown · non-contrast
Comparison: None.

CLINICAL DATA: Radiculopathy bilaterally

EXAM:
LUMBAR SPINE - COMPLETE 4+ VIEW

[Series 1: dg lumbar spine complete 4 +v · 0.14mm/px · 5 of 5 slices shown]
[im 1/5]
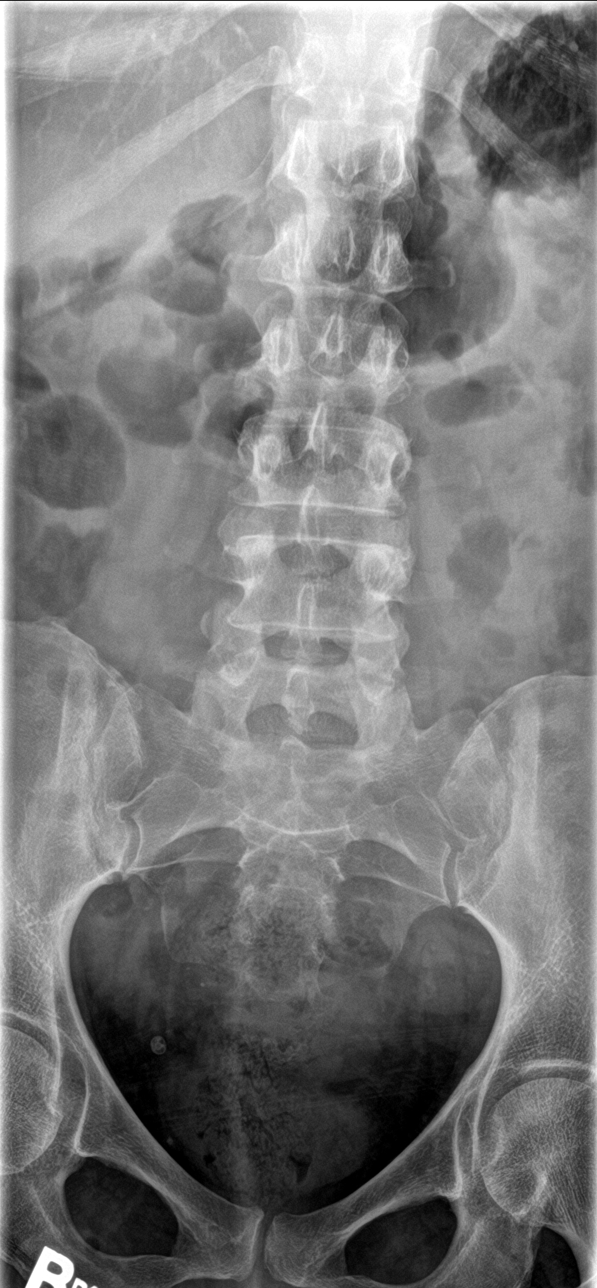
[im 2/5]
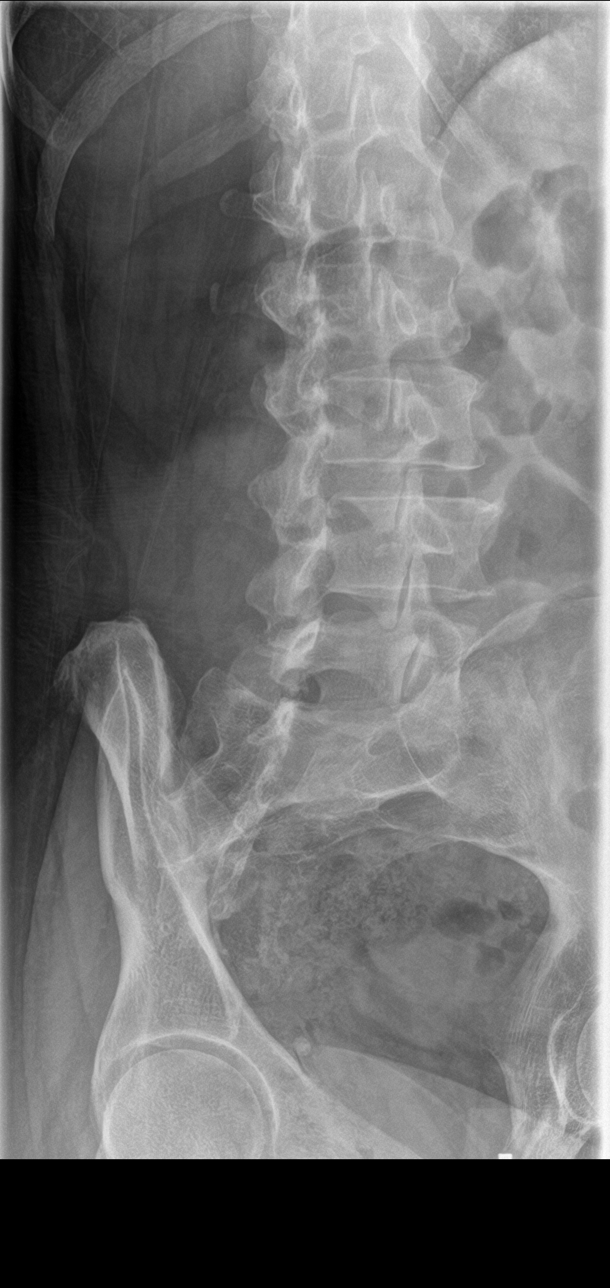
[im 3/5]
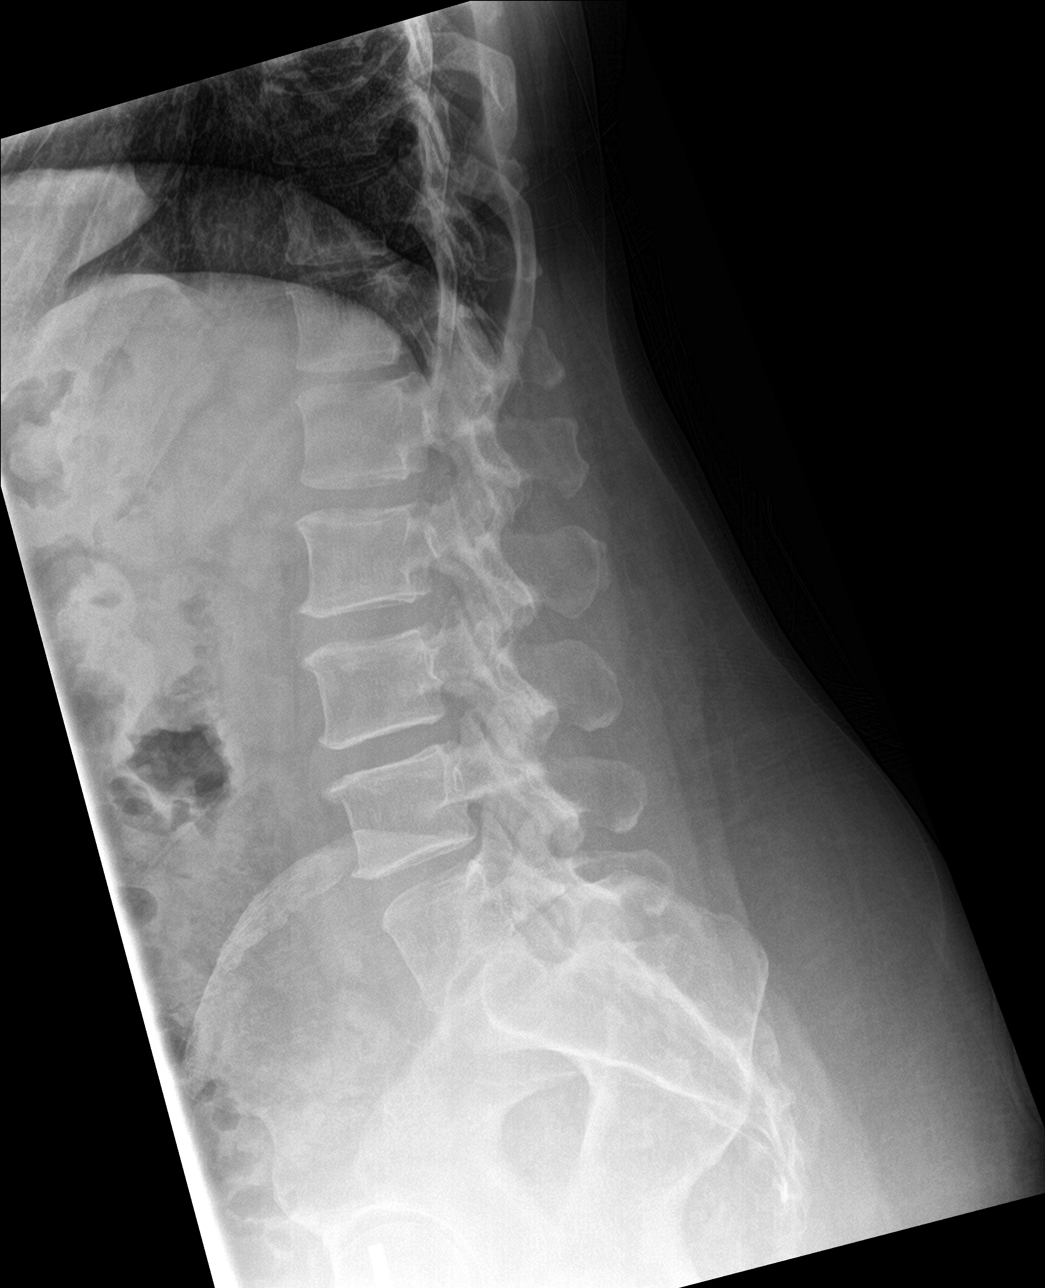
[im 4/5]
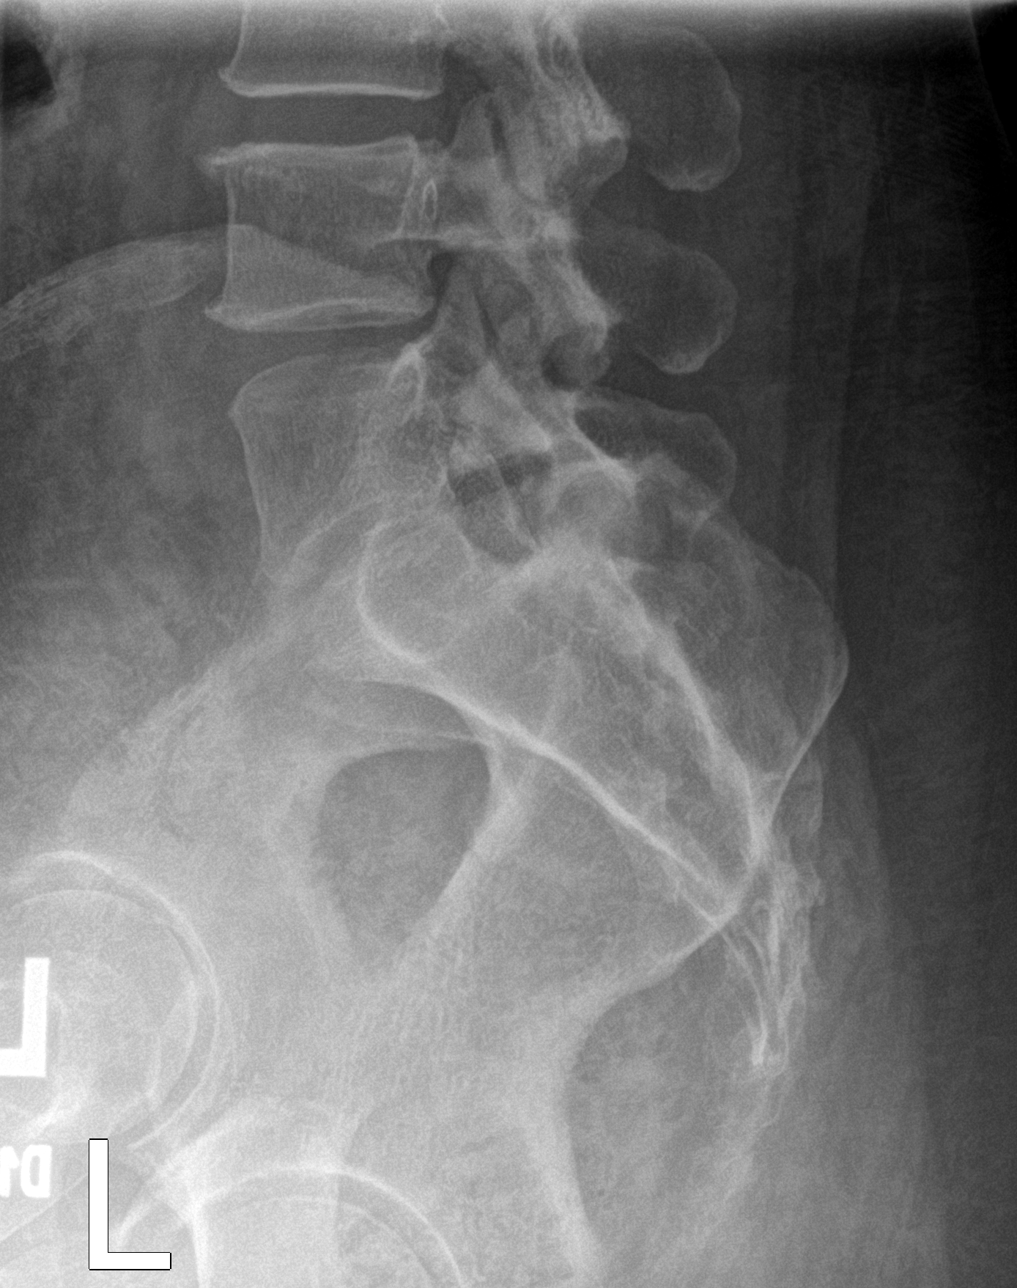
[im 5/5]
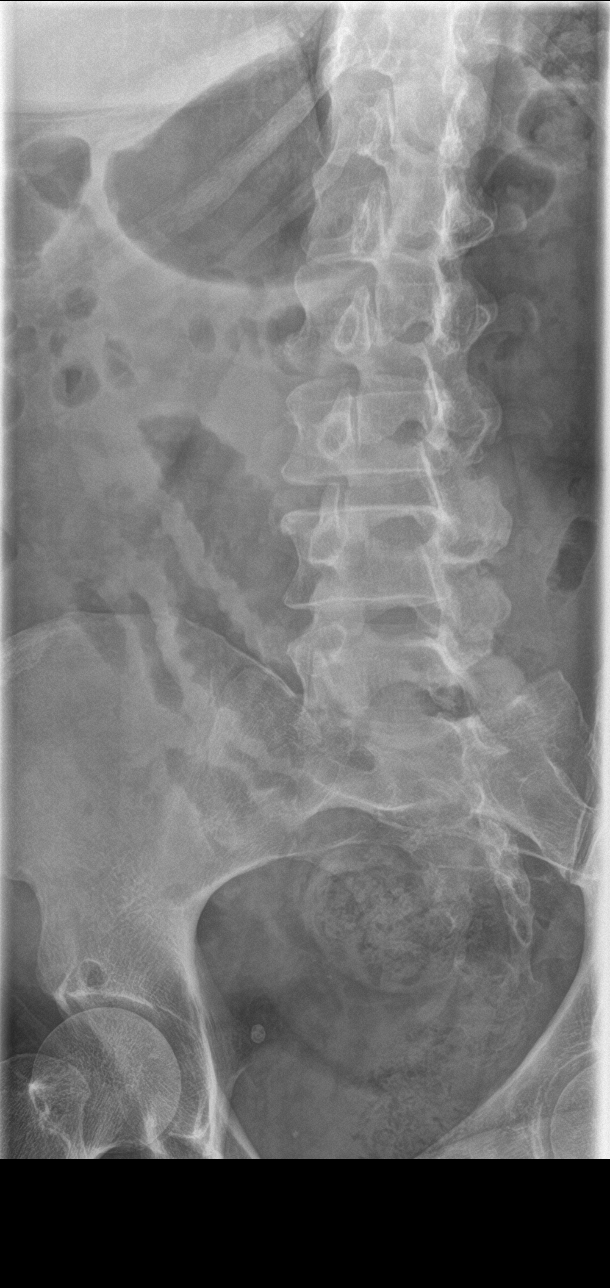

[5 of 5 positions shown; findings below may reference images not displayed]

FINDINGS: Lumbar vertebral body heights and alignment are maintained. No
significant disc space narrowing or facet hypertrophy.
IMPRESSION: Negative.

## 2021-09-28 ENCOUNTER — Other Ambulatory Visit: Payer: Self-pay | Admitting: Family Medicine

## 2021-09-28 DIAGNOSIS — Z1231 Encounter for screening mammogram for malignant neoplasm of breast: Secondary | ICD-10-CM

## 2021-10-07 ENCOUNTER — Other Ambulatory Visit: Payer: Self-pay | Admitting: Family Medicine

## 2021-10-07 DIAGNOSIS — N6311 Unspecified lump in the right breast, upper outer quadrant: Secondary | ICD-10-CM

## 2021-10-13 ENCOUNTER — Other Ambulatory Visit: Payer: Self-pay | Admitting: Family Medicine

## 2021-10-13 DIAGNOSIS — N6311 Unspecified lump in the right breast, upper outer quadrant: Secondary | ICD-10-CM

## 2022-02-24 ENCOUNTER — Inpatient Hospital Stay: Admission: RE | Admit: 2022-02-24 | Payer: Medicaid Other | Source: Ambulatory Visit

## 2022-02-24 ENCOUNTER — Other Ambulatory Visit: Payer: Medicaid Other

## 2022-05-25 ENCOUNTER — Ambulatory Visit: Payer: Medicaid Other | Admitting: Podiatry

## 2022-05-26 ENCOUNTER — Ambulatory Visit (INDEPENDENT_AMBULATORY_CARE_PROVIDER_SITE_OTHER): Payer: Medicaid Other | Admitting: Podiatry

## 2022-05-26 ENCOUNTER — Ambulatory Visit (INDEPENDENT_AMBULATORY_CARE_PROVIDER_SITE_OTHER): Payer: Medicaid Other

## 2022-05-26 DIAGNOSIS — M79671 Pain in right foot: Secondary | ICD-10-CM

## 2022-05-26 DIAGNOSIS — M216X1 Other acquired deformities of right foot: Secondary | ICD-10-CM | POA: Diagnosis not present

## 2022-05-26 DIAGNOSIS — M19071 Primary osteoarthritis, right ankle and foot: Secondary | ICD-10-CM

## 2022-05-26 MED ORDER — METHYLPREDNISOLONE 4 MG PO TBPK: ORAL_TABLET | ORAL | 0 refills | Status: AC

## 2022-05-31 NOTE — Progress Notes (Signed)
Subjective:  Patient ID: Nancy Chase, female    DOB: 12/15/1960,  MRN: [id number]  No chief complaint on file.   61 y.o. female presents with the above complaint.  Patient presents with complaint of right dorsal midfoot pain.  Patient states causing her pain and swelling.  She went to get it evaluated.  She states that it is still manageable she does not want to injection.  She would like to discuss other treatment options.  Pain scale is 5 out of 10 dull aching nature noticeable when ambulating.  She does work on her feet.   Review of Systems: Negative except as noted in the HPI. Denies N/V/F/Ch.  No past medical history on file.  Current Outpatient Medications:    acetaminophen (TYLENOL) 500 MG tablet, , Disp: , Rfl:    amitriptyline (ELAVIL) 25 MG tablet, Take 1 tablet by mouth daily., Disp: , Rfl:    celecoxib (CELEBREX) 100 MG capsule, See admin instructions., Disp: , Rfl:    fluconazole (DIFLUCAN) 150 MG tablet, TAKE 1 TABLET BYMOUTH AS A SINGLE DOSE FOR YEAST INFECTION. MAY REPEAT IN 3 DAYS IF SYMPTOMS PERSIST, Disp: , Rfl:    hydrochlorothiazide (MICROZIDE) 12.5 MG capsule, Take 12.5 mg by mouth daily., Disp: , Rfl:    methylPREDNISolone (MEDROL DOSEPAK) 4 MG TBPK tablet, Take as directed, Disp: 21 each, Rfl: 0   amLODipine-benazepril (LOTREL) 5-20 MG capsule, amlodipine 5 mg-benazepril 20 mg capsule  TAKE 2 CAPSULES BY MOUTH AT THE SAME TIME EVERY DAY, Disp: , Rfl:    aspirin 81 MG EC tablet, aspirin 81 mg tablet,delayed release  TAKE 1 TABLET BY MOUTH DAILY FOR HEART PROTECTION, Disp: , Rfl:    atorvastatin (LIPITOR) 40 MG tablet, TAKE 1 TABLET BY MOUTH AT BEDTIME FOR HIGH CHOLESTEROL, Disp: , Rfl:    Cetirizine HCl 10 MG CAPS, Take by mouth., Disp: , Rfl:    Fluoxetine HCl, PMDD, 20 MG TABS, Take by mouth., Disp: , Rfl:    mirabegron ER (MYRBETRIQ) 50 MG TB24 tablet, Take 1 tablet (50 mg total) by mouth daily., Disp: 30 tablet, Rfl: 11   naproxen (NAPROSYN) 500 MG tablet,  naproxen 500 mg tablet  TAKE 1 TABLET BY MOUTH TWICE A DAY WITH MEALS, Disp: , Rfl:   Social History   Tobacco Use  Smoking Status Former   Packs/day: 0.25   Years: 20.00   Total pack years: 5.00   Types: Cigarettes   Quit date: 12/16/2014   Years since quitting: 7.4  Smokeless Tobacco Never    No Known Allergies Objective:  There were no vitals filed for this visit. There is no height or weight on file to calculate BMI. Constitutional Well developed. Well nourished.  Vascular Dorsalis pedis pulses palpable bilaterally. Posterior tibial pulses palpable bilaterally. Capillary refill normal to all digits.  No cyanosis or clubbing noted. Pedal hair growth normal.  Neurologic Normal speech. Oriented to person, place, and time. Epicritic sensation to light touch grossly present bilaterally.  Dermatologic Nails well groomed and normal in appearance. No open wounds. No skin lesions.  Orthopedic: Pain on palpation of the right dorsal midfoot.  Clinically able to appreciate underlying arthritis.  Pain with range of motion of the tarsometatarsal joints 2 through 5.  Lisfranc interval is intact.  No extensor tendinitis noted   Radiographs: 3 views of skeletally mature the right foot: Mild midfoot arthritis noted.  Pes planovalgus foot deformity noted.  No other bony abnormalities noted on the x-ray Assessment:   1. Arthritis  of right midfoot    Plan:  Patient was evaluated and treated and all questions answered.  Right midfoot arthritis -All questions and concerns were discussed with the patient in extensive detail.  Ultimately I discussed with her she will benefit from a steroid injection however patient would like to hold off and discuss oral medication for now.  If there is no improvement we will discuss steroid injection under next medical visit.  I discussed shoe gear modification as well.  No follow-ups on file.

## 2022-08-25 ENCOUNTER — Ambulatory Visit: Payer: Medicaid Other | Admitting: Dermatology

## 2022-09-08 ENCOUNTER — Ambulatory Visit: Payer: Medicaid Other | Admitting: Dermatology

## 2023-01-06 ENCOUNTER — Other Ambulatory Visit: Payer: Self-pay | Admitting: Family Medicine

## 2023-01-06 DIAGNOSIS — Z1231 Encounter for screening mammogram for malignant neoplasm of breast: Secondary | ICD-10-CM

## 2023-01-18 ENCOUNTER — Other Ambulatory Visit: Payer: Self-pay | Admitting: *Deleted

## 2023-01-18 DIAGNOSIS — F17210 Nicotine dependence, cigarettes, uncomplicated: Secondary | ICD-10-CM

## 2023-01-18 DIAGNOSIS — Z87891 Personal history of nicotine dependence: Secondary | ICD-10-CM

## 2023-01-18 DIAGNOSIS — Z122 Encounter for screening for malignant neoplasm of respiratory organs: Secondary | ICD-10-CM

## 2023-01-26 ENCOUNTER — Ambulatory Visit: Payer: MEDICAID | Admitting: Podiatry

## 2023-02-02 ENCOUNTER — Ambulatory Visit
Admission: RE | Admit: 2023-02-02 | Discharge: 2023-02-02 | Disposition: A | Discharge status: Home / Self Care | Payer: MEDICAID | Source: Ambulatory Visit | Attending: Family Medicine | Admitting: Family Medicine

## 2023-02-02 DIAGNOSIS — Z1231 Encounter for screening mammogram for malignant neoplasm of breast: Secondary | ICD-10-CM | POA: Insufficient documentation

## 2023-02-07 ENCOUNTER — Ambulatory Visit: Payer: MEDICAID | Admitting: Podiatry

## 2023-02-07 DIAGNOSIS — D492 Neoplasm of unspecified behavior of bone, soft tissue, and skin: Secondary | ICD-10-CM

## 2023-02-07 NOTE — Progress Notes (Signed)
Subjective:  Patient ID: Nancy Chase, female    DOB: Nov 18, 1960,  MRN: [id number]  Chief Complaint  Patient presents with   Callouses    62 y.o. female presents with the above complaint.  Patient presents with right fifth metatarsal base porokeratotic lesion painful to touch is progressive and worsens with ambulation is with pressure she would like for me to debride down.  She is not able to take care of herself denies any other acute complaints.  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   Review of Systems: Negative except as noted in the HPI. Denies N/V/F/Ch.  No past medical history on file.  Current Outpatient Medications:    acetaminophen (TYLENOL) 500 MG tablet, , Disp: , Rfl:    amitriptyline (ELAVIL) 25 MG tablet, Take 1 tablet by mouth daily., Disp: , Rfl:    amLODipine-benazepril (LOTREL) 5-20 MG capsule, amlodipine 5 mg-benazepril 20 mg capsule  TAKE 2 CAPSULES BY MOUTH AT THE SAME TIME EVERY DAY, Disp: , Rfl:    aspirin 81 MG EC tablet, aspirin 81 mg tablet,delayed release  TAKE 1 TABLET BY MOUTH DAILY FOR HEART PROTECTION, Disp: , Rfl:    atorvastatin (LIPITOR) 40 MG tablet, TAKE 1 TABLET BY MOUTH AT BEDTIME FOR HIGH CHOLESTEROL, Disp: , Rfl:    celecoxib (CELEBREX) 100 MG capsule, See admin instructions., Disp: , Rfl:    Cetirizine HCl 10 MG CAPS, Take by mouth., Disp: , Rfl:    fluconazole (DIFLUCAN) 150 MG tablet, TAKE 1 TABLET BYMOUTH AS A SINGLE DOSE FOR YEAST INFECTION. MAY REPEAT IN 3 DAYS IF SYMPTOMS PERSIST, Disp: , Rfl:    Fluoxetine HCl, PMDD, 20 MG TABS, Take by mouth., Disp: , Rfl:    hydrochlorothiazide (MICROZIDE) 12.5 MG capsule, Take 12.5 mg by mouth daily., Disp: , Rfl:    methylPREDNISolone (MEDROL DOSEPAK) 4 MG TBPK tablet, Take as directed, Disp: 21 each, Rfl: 0   mirabegron ER (MYRBETRIQ) 50 MG TB24 tablet, Take 1 tablet (50 mg total) by mouth daily., Disp: 30 tablet, Rfl: 11   naproxen (NAPROSYN) 500 MG tablet, naproxen 500 mg tablet   TAKE 1 TABLET BY MOUTH TWICE A DAY WITH MEALS, Disp: , Rfl:   Social History   Tobacco Use  Smoking Status Former   Current packs/day: 0.00   Average packs/day: 0.3 packs/day for 20.0 years (5.0 ttl pk-yrs)   Types: Cigarettes   Start date: 12/16/1994   Quit date: 12/16/2014   Years since quitting: 8.1  Smokeless Tobacco Never    No Known Allergies Objective:  There were no vitals filed for this visit. There is no height or weight on file to calculate BMI. Constitutional Well developed. Well nourished.  Vascular Dorsalis pedis pulses palpable bilaterally. Posterior tibial pulses palpable bilaterally. Capillary refill normal to all digits.  No cyanosis or clubbing noted. Pedal hair growth normal.  Neurologic Normal speech. Oriented to person, place, and time. Epicritic sensation to light touch grossly present bilaterally.  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  Pain on palpation to the lesion  Orthopedic: Normal joint ROM without pain or crepitus bilaterally. No visible deformities. No bony tenderness.   Radiographs: None Assessment:   1. Skin neoplasm    Plan:  Patient was evaluated and treated and all questions answered.  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  No complication noted no pinpoint bleeding noted. -Sugar modification was discussed  No follow-ups on file.

## 2023-02-09 ENCOUNTER — Emergency Department: Payer: MEDICAID

## 2023-02-09 ENCOUNTER — Emergency Department
Admission: EM | Admit: 2023-02-09 | Discharge: 2023-02-09 | Disposition: A | Discharge status: Home / Self Care | Payer: MEDICAID | Attending: Emergency Medicine | Admitting: Emergency Medicine

## 2023-02-09 ENCOUNTER — Other Ambulatory Visit: Payer: Self-pay

## 2023-02-09 DIAGNOSIS — R001 Bradycardia, unspecified: Secondary | ICD-10-CM | POA: Diagnosis not present

## 2023-02-09 DIAGNOSIS — R079 Chest pain, unspecified: Secondary | ICD-10-CM | POA: Diagnosis present

## 2023-02-09 LAB — CBC
HCT: 51.3 % — ABNORMAL HIGH (ref 36.0–46.0)
Hemoglobin: 17.5 g/dL — ABNORMAL HIGH (ref 12.0–15.0)
MCH: 30.9 pg (ref 26.0–34.0)
MCHC: 34.1 g/dL (ref 30.0–36.0)
MCV: 90.6 fL (ref 80.0–100.0)
Platelets: 266 10*3/uL (ref 150–400)
RBC: 5.66 MIL/uL — ABNORMAL HIGH (ref 3.87–5.11)
RDW: 14 % (ref 11.5–15.5)
WBC: 6.7 10*3/uL (ref 4.0–10.5)
nRBC: 0 % (ref 0.0–0.2)

## 2023-02-09 LAB — URINALYSIS, ROUTINE W REFLEX MICROSCOPIC
Bilirubin Urine: NEGATIVE
Glucose, UA: NEGATIVE mg/dL
Hgb urine dipstick: NEGATIVE
Ketones, ur: NEGATIVE mg/dL
Leukocytes,Ua: NEGATIVE
Nitrite: NEGATIVE
Protein, ur: NEGATIVE mg/dL
Specific Gravity, Urine: 1.017 (ref 1.005–1.030)
pH: 5 (ref 5.0–8.0)

## 2023-02-09 LAB — BASIC METABOLIC PANEL
Anion gap: 12 (ref 5–15)
BUN: 16 mg/dL (ref 6–20)
CO2: 23 mmol/L (ref 22–32)
Calcium: 9.2 mg/dL (ref 8.9–10.3)
Chloride: 106 mmol/L (ref 98–111)
Creatinine, Ser: 0.78 mg/dL (ref 0.44–1.00)
GFR, Estimated: >60 mL/min (ref >60)
Glucose, Bld: 103 mg/dL — ABNORMAL HIGH (ref 70–99)
Potassium: 3.6 mmol/L (ref 3.5–5.1)
Sodium: 141 mmol/L (ref 135–145)

## 2023-02-09 LAB — TROPONIN I (HIGH SENSITIVITY): Troponin I (High Sensitivity): 5 ng/L (ref <18)

## 2023-02-09 NOTE — ED Triage Notes (Signed)
Pt to ED for chest pain x1 month. Reports here for consistent pain. Denies n/v/shob.

## 2023-02-09 NOTE — Discharge Instructions (Signed)
Follow-up with your regular doctor.  Call Dr. Donata Duff for an appointment.  He is the cardiologist that you saw a few years ago.  Return emergency department if worsening

## 2023-02-09 NOTE — ED Provider Notes (Signed)
[facility] Provider Note    Event Date/Time   First MD Initiated Contact with Patient 02/09/23 1137     (approximate)   History   Chest Pain   HPI  Nancy Chase is a 62 y.o. female with history of anxiety and nonspecific chest pain presents emergency department complaining of chest pain for 1 month.  States the pain has been consistent because it happens often.  States does radiate to the left arm.  States she has a lot of anxiety because she is caring for her mother etc.  States that she does not get sweaty, short of breath or have vomiting with the pain.  No known injury.  States it does hurt more with movement      Physical Exam   Triage Vital Signs: ED Triage Vitals [02/09/23 1003]  Encounter Vitals Group     BP (!) 142/99     Systolic BP Percentile      Diastolic BP Percentile      Pulse Rate 63     Resp 18     Temp 98.6 F (37 C)     Temp src      SpO2 95 %     Weight 164 lb (74.4 kg)     Height 5\' 1"  (1.549 m)     Head Circumference      Peak Flow      Pain Score 6     Pain Loc      Pain Education      Exclude from Growth Chart     Most recent vital signs: Vitals:   02/09/23 1003 02/09/23 1216  BP: (!) 142/99 138/89  Pulse: 63 60  Resp: 18 18  Temp: 98.6 F (37 C)   SpO2: 95% 96%     General: Awake, no distress.   CV:  Good peripheral perfusion. regular rate and  rhythm Resp:  Normal effort. Lungs cta Abd:  No distention.   Other:     ED Results / Procedures / Treatments   Labs (all labs ordered are listed, but only abnormal results are displayed) Labs Reviewed  BASIC METABOLIC PANEL - Abnormal; Notable for the following components:      Result Value   Glucose, Bld 103 (*)    All other components within normal limits  CBC - Abnormal; Notable for the following components:   RBC 5.66 (*)    Hemoglobin 17.5 (*)    HCT 51.3 (*)    All other components within normal limits  URINALYSIS, ROUTINE W REFLEX  MICROSCOPIC - Abnormal; Notable for the following components:   Color, Urine YELLOW (*)    APPearance HAZY (*)    All other components within normal limits  TROPONIN I (HIGH SENSITIVITY)     EKG  EKG   RADIOLOGY cxr    PROCEDURES:   Procedures   MEDICATIONS ORDERED IN ED: Medications - No data to display   IMPRESSION / MDM / ASSESSMENT AND PLAN / ED COURSE  I reviewed the triage vital signs and the nursing notes.                              Differential diagnosis includes, but is not limited to, MI angina, anxiety, PE  Patient's presentation is most consistent with acute presentation with potential threat to life or bodily function.   EKG shows bradycardia  Labs are reassuring  Chest x-ray independently reviewed and  interpreted by me as being negative for any acute abnormality  The patient appears to have more anxiety is tearful, denies SI/HI.  Would like a referral to counseling services.  Also feel that she needs to follow-up with cardiology due to the ongoing chest pain.  May very well be anxiety but with her past medical history of previous episodes of chest pain feel it is important for cardiology evaluate her.  Patient is in agreement treatment plan.  She is discharged stable condition.      FINAL CLINICAL IMPRESSION(S) / ED DIAGNOSES   Final diagnoses:  Nonspecific chest pain     Rx / DC Orders   ED Discharge Orders     None        Note:  This document was prepared using Dragon voice recognition software and may include unintentional dictation errors.    Faythe Ghee, PA-C 02/09/23 1302    Merwyn Katos, MD 02/10/23 [phone]

## 2023-02-17 ENCOUNTER — Ambulatory Visit (INDEPENDENT_AMBULATORY_CARE_PROVIDER_SITE_OTHER): Payer: MEDICAID | Admitting: Physician Assistant

## 2023-02-17 ENCOUNTER — Ambulatory Visit: Admission: RE | Admit: 2023-02-17 | Payer: MEDICAID | Source: Ambulatory Visit

## 2023-02-17 DIAGNOSIS — Z91199 Patient's noncompliance with other medical treatment and regimen due to unspecified reason: Secondary | ICD-10-CM

## 2023-02-17 NOTE — Progress Notes (Signed)
Virtual Visit via Telephone Note  Pt did not answer for shared decision making visit  Darcella Gasman Stiven Kaspar, PA-C

## 2023-03-23 ENCOUNTER — Ambulatory Visit (INDEPENDENT_AMBULATORY_CARE_PROVIDER_SITE_OTHER): Payer: MEDICAID | Admitting: Podiatry

## 2023-03-23 DIAGNOSIS — D492 Neoplasm of unspecified behavior of bone, soft tissue, and skin: Secondary | ICD-10-CM

## 2023-03-23 DIAGNOSIS — Z01818 Encounter for other preprocedural examination: Secondary | ICD-10-CM

## 2023-03-23 NOTE — Progress Notes (Signed)
Subjective:  Patient ID: Nancy Chase, female    DOB: Oct 01, 1960,  MRN: [id number]  Chief Complaint  Patient presents with   Foot Pain    62 y.o. female presents with the above complaint.  Patient presents with right fifth metatarsal base porokeratotic lesion that has not gotten any better the debridement gave her some relief but came back and is hurting again.  She would like to discuss surgical excision   Review of Systems: Negative except as noted in the HPI. Denies N/V/F/Ch.  No past medical history on file.  Current Outpatient Medications:    acetaminophen (TYLENOL) 500 MG tablet, , Disp: , Rfl:    amitriptyline (ELAVIL) 25 MG tablet, Take 1 tablet by mouth daily., Disp: , Rfl:    amLODipine-benazepril (LOTREL) 5-20 MG capsule, amlodipine 5 mg-benazepril 20 mg capsule  TAKE 2 CAPSULES BY MOUTH AT THE SAME TIME EVERY DAY, Disp: , Rfl:    aspirin 81 MG EC tablet, aspirin 81 mg tablet,delayed release  TAKE 1 TABLET BY MOUTH DAILY FOR HEART PROTECTION, Disp: , Rfl:    atorvastatin (LIPITOR) 40 MG tablet, TAKE 1 TABLET BY MOUTH AT BEDTIME FOR HIGH CHOLESTEROL, Disp: , Rfl:    celecoxib (CELEBREX) 100 MG capsule, See admin instructions., Disp: , Rfl:    Cetirizine HCl 10 MG CAPS, Take by mouth., Disp: , Rfl:    fluconazole (DIFLUCAN) 150 MG tablet, TAKE 1 TABLET BYMOUTH AS A SINGLE DOSE FOR YEAST INFECTION. MAY REPEAT IN 3 DAYS IF SYMPTOMS PERSIST, Disp: , Rfl:    Fluoxetine HCl, PMDD, 20 MG TABS, Take by mouth., Disp: , Rfl:    hydrochlorothiazide (MICROZIDE) 12.5 MG capsule, Take 12.5 mg by mouth daily., Disp: , Rfl:    methylPREDNISolone (MEDROL DOSEPAK) 4 MG TBPK tablet, Take as directed, Disp: 21 each, Rfl: 0   mirabegron ER (MYRBETRIQ) 50 MG TB24 tablet, Take 1 tablet (50 mg total) by mouth daily., Disp: 30 tablet, Rfl: 11   naproxen (NAPROSYN) 500 MG tablet, naproxen 500 mg tablet  TAKE 1 TABLET BY MOUTH TWICE A DAY WITH MEALS, Disp: , Rfl:   Social History   Tobacco Use   Smoking Status Former   Current packs/day: 0.00   Average packs/day: 0.3 packs/day for 20.0 years (5.0 ttl pk-yrs)   Types: Cigarettes   Start date: 12/16/1994   Quit date: 12/16/2014   Years since quitting: 8.2  Smokeless Tobacco Never    No Known Allergies Objective:  There were no vitals filed for this visit. There is no height or weight on file to calculate BMI. Constitutional Well developed. Well nourished.  Vascular Dorsalis pedis pulses palpable bilaterally. Posterior tibial pulses palpable bilaterally. Capillary refill normal to all digits.  No cyanosis or clubbing noted. Pedal hair growth normal.  Neurologic Normal speech. Oriented to person, place, and time. Epicritic sensation to light touch grossly present bilaterally.  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  Pain on palpation to the lesion  Orthopedic: Normal joint ROM without pain or crepitus bilaterally. No visible deformities. No bony tenderness.   Radiographs: None Assessment:   1. Skin neoplasm   2. Encounter for preoperative examination for general surgical procedure     Plan:  Patient was evaluated and treated and all questions answered.  Right fifth metatarsal base porokeratotic lesion/skin neoplasm -All questions and concerns were discussed with the patient extensive detail given the amount of pain that she is having she will benefit from debridement of the lesion -Given  that this has continues to cause her pain and she has failed all shoe gear modification padding protecting offloading patient will benefit from surgical excision of the skin lesion/skin neoplasm.  I discussed my preoperative intra or postoperative plan with the patient in extensive detail she states understanding like to proceed with surgery -Informed surgical risk consent was reviewed and read aloud to the patient.  I reviewed the films.  I have discussed my findings with the patient in  great detail.  I have discussed all risks including but not limited to infection, stiffness, scarring, limp, disability, deformity, damage to blood vessels and nerves, numbness, poor healing, need for braces, arthritis, chronic pain, amputation, death.  All benefits and realistic expectations discussed in great detail.  I have made no promises as to the outcome.  I have provided realistic expectations.  I have offered the patient a 2nd opinion, which they have declined and assured me they preferred to proceed despite the risks   No follow-ups on file.

## 2023-04-04 ENCOUNTER — Telehealth: Payer: Self-pay | Admitting: Podiatry

## 2023-04-04 NOTE — Telephone Encounter (Signed)
DOS 05/01/2023  EXC. BENIGN LESION OVER 4.0 CM [id number]  [facility] EFFECTIVE DATE- 03/29/2023  PER THE [facility] PROVIDER PORTAL, PRIOR AUTHORIZATION HAS BEEN APPROVED FOR CPT CODE 57846. GOOD FROM 05/01/2023 - 07/01/2023.   Nancy Chase ID : [id number]

## 2023-04-12 ENCOUNTER — Ambulatory Visit: Payer: Medicaid Other | Admitting: Dermatology

## 2023-04-21 ENCOUNTER — Telehealth: Payer: Self-pay | Admitting: Podiatry

## 2023-04-21 NOTE — Telephone Encounter (Signed)
Patient called to cancel surgery. Reason: financial issues

## 2023-05-09 ENCOUNTER — Encounter: Payer: MEDICAID | Admitting: Podiatry

## 2023-05-23 ENCOUNTER — Encounter: Payer: MEDICAID | Admitting: Podiatry

## 2023-08-14 ENCOUNTER — Ambulatory Visit (INDEPENDENT_AMBULATORY_CARE_PROVIDER_SITE_OTHER): Payer: MEDICAID | Admitting: Acute Care

## 2023-08-14 DIAGNOSIS — F17210 Nicotine dependence, cigarettes, uncomplicated: Secondary | ICD-10-CM | POA: Diagnosis not present

## 2023-08-14 NOTE — Patient Instructions (Signed)

## 2023-08-14 NOTE — Progress Notes (Signed)
 Provider Attestation I agree with the documentation of the Shared Decision Making visit,  smoking cessation counseling if appropriate, and verification or eligibility for lung cancer screening as documented by the RN Nurse Navigator.   Raejean Bullock, MSN, AGACNP-BC [facility] See Amion for personal pager PCCM on call pager [phone]     Virtual Visit via Telephone Note  I connected with Nancy Chase on 08/14/23 at  3:30 PM EST by telephone and verified that I am speaking with the correct person using two identifiers.  Location: Patient: in home Provider: [address], [place], [place], [address]   Shared Decision Making Visit Lung Cancer Screening Program [id number])   Eligibility: Age 63 y.o. Pack Years Smoking History Calculation 25 (# packs/per year x # years smoked) Recent History of coughing up blood  no Unexplained weight loss? no ( >Than 15 pounds within the last 6 months ) Prior History Lung / other cancer yes (Diagnosis within the last 5 years already requiring surveillance chest CT Scans). Smoking Status Current Smoker Former Smokers: Years since quit:  NA  Quit Date: NA  Visit Components: Discussion included one or more decision making aids. yes Discussion included risk/benefits of screening. yes Discussion included potential follow up diagnostic testing for abnormal scans. yes Discussion included meaning and risk of over diagnosis. yes Discussion included meaning and risk of False Positives. yes Discussion included meaning of total radiation exposure. yes  Counseling Included: Importance of adherence to annual lung cancer LDCT screening. yes Impact of comorbidities on ability to participate in the program. yes Ability and willingness to under diagnostic treatment. yes  Smoking Cessation Counseling: Current Smokers:  Discussed importance of smoking cessation. yes Information about tobacco cessation classes and  interventions provided to patient. yes Patient provided with "ticket" for LDCT Scan. yes Symptomatic Patient. no  Counseling NA Diagnosis Code: Tobacco Use Z72.0 Asymptomatic Patient yes  Counseling (Intermediate counseling: > three minutes counseling) V9563 Former Smokers:  Discussed the importance of maintaining cigarette abstinence. yes Diagnosis Code: Personal History of Nicotine Dependence. O75.643 Information about tobacco cessation classes and interventions provided to patient. Yes Patient provided with "ticket" for LDCT Scan. yes Written Order for Lung Cancer Screening with LDCT placed in Epic. Yes (CT Chest Lung Cancer Screening Low Dose W/O CM) [id number] Z12.2-Screening of respiratory organs Z87.891-Personal history of nicotine dependence   Valentin Gaskins, RN 08/14/23

## 2023-08-15 ENCOUNTER — Ambulatory Visit: Payer: MEDICAID

## 2023-08-21 ENCOUNTER — Ambulatory Visit: Admission: RE | Admit: 2023-08-21 | Payer: MEDICAID | Source: Ambulatory Visit

## 2023-09-11 ENCOUNTER — Ambulatory Visit: Payer: MEDICAID | Admitting: Dermatology

## 2023-09-14 ENCOUNTER — Ambulatory Visit: Payer: Medicaid Other | Admitting: Dermatology

## 2023-10-24 ENCOUNTER — Ambulatory Visit: Payer: MEDICAID | Admitting: Podiatry

## 2023-10-24 DIAGNOSIS — R102 Pelvic and perineal pain: Secondary | ICD-10-CM | POA: Insufficient documentation

## 2023-10-24 DIAGNOSIS — M62462 Contracture of muscle, left lower leg: Secondary | ICD-10-CM

## 2023-10-24 DIAGNOSIS — M722 Plantar fascial fibromatosis: Secondary | ICD-10-CM | POA: Diagnosis not present

## 2023-10-24 NOTE — Patient Instructions (Signed)
 Marland Kitchen  tf

## 2023-10-24 NOTE — Progress Notes (Signed)
 Subjective:  Patient ID: Nancy Chase, female    DOB: [birth year],  MRN: [id number]  Chief Complaint  Patient presents with   Injections    63 y.o. female presents with the above complaint.  She presents with left heel pain that has been for quite some time has progressive gotten worse worse with ambulation.  Patient states that it is progressive and worse worse with ambulation pain scale 7 out of 10 sharp shooting.  She wanted to get it evaluated denies seeing anyone else prior to seeing me for this.  Review of Systems: Negative except as noted in the HPI. Denies N/V/F/Ch.  No past medical history on file.  Current Outpatient Medications:    acyclovir (ZOVIRAX) 800 MG tablet, Take 800 mg by mouth 3 (three) times daily., Disp: , Rfl:    amoxicillin-clavulanate (AUGMENTIN) 875-125 MG tablet, Take 1 tablet by mouth 2 (two) times daily., Disp: , Rfl:    benzonatate (TESSALON) 100 MG capsule, Take by mouth., Disp: , Rfl:    CLARITIN-D 24 HOUR 10-240 MG 24 hr tablet, Take 1 tablet by mouth daily., Disp: , Rfl:    cyclobenzaprine (FLEXERIL) 10 MG tablet, Take 10 mg by mouth every 8 (eight) hours as needed., Disp: , Rfl:    famotidine (PEPCID) 20 MG tablet, Take 20 mg by mouth 2 (two) times daily as needed., Disp: , Rfl:    isosorbide mononitrate (IMDUR) 30 MG 24 hr tablet, Take by mouth., Disp: , Rfl:    loratadine (CLARITIN) 10 MG tablet, Take 10 mg by mouth daily., Disp: , Rfl:    acetaminophen (TYLENOL) 500 MG tablet, , Disp: , Rfl:    acyclovir (ZOVIRAX) 400 MG tablet, Take 1 tablet by mouth 3 (three) times daily., Disp: , Rfl:    amitriptyline (ELAVIL) 25 MG tablet, Take 1 tablet by mouth daily., Disp: , Rfl:    amLODipine-benazepril (LOTREL) 5-20 MG capsule, amlodipine 5 mg-benazepril 20 mg capsule  TAKE 2 CAPSULES BY MOUTH AT THE SAME TIME EVERY DAY, Disp: , Rfl:    aspirin 81 MG EC tablet, aspirin 81 mg tablet,delayed release  TAKE 1 TABLET BY MOUTH DAILY FOR HEART PROTECTION, Disp: ,  Rfl:    atorvastatin (LIPITOR) 40 MG tablet, TAKE 1 TABLET BY MOUTH AT BEDTIME FOR HIGH CHOLESTEROL, Disp: , Rfl:    celecoxib (CELEBREX) 100 MG capsule, See admin instructions., Disp: , Rfl:    cephALEXin (KEFLEX) 500 MG capsule, TAKE 1 CAPSULE BY MOUTH 3 TIMES A DAY FOR 10 DAYS FOR INFECTION, Disp: , Rfl:    Cetirizine HCl 10 MG CAPS, Take by mouth., Disp: , Rfl:    clobetasol cream (TEMOVATE) 0.05 %, APPLY TWICE DAILY TO FINGER, Disp: , Rfl:    fluconazole (DIFLUCAN) 150 MG tablet, TAKE 1 TABLET BYMOUTH AS A SINGLE DOSE FOR YEAST INFECTION. MAY REPEAT IN 3 DAYS IF SYMPTOMS PERSIST, Disp: , Rfl:    FLUoxetine (PROZAC) 20 MG capsule, TAKE 1 CAPSULE BY MOUTH DAILY FOR DEPRESSION, Disp: , Rfl:    Fluoxetine HCl, PMDD, 20 MG TABS, Take by mouth., Disp: , Rfl:    hydrochlorothiazide (MICROZIDE) 12.5 MG capsule, Take 12.5 mg by mouth daily., Disp: , Rfl:    methylPREDNISolone (MEDROL DOSEPAK) 4 MG TBPK tablet, Take as directed, Disp: 21 each, Rfl: 0   mirabegron ER (MYRBETRIQ) 50 MG TB24 tablet, Take 1 tablet (50 mg total) by mouth daily., Disp: 30 tablet, Rfl: 11   naproxen (NAPROSYN) 500 MG tablet, naproxen 500 mg tablet  TAKE  1 TABLET BY MOUTH TWICE A DAY WITH MEALS, Disp: , Rfl:   Social History   Tobacco Use  Smoking Status Every Day   Current packs/day: 0.00   Average packs/day: 0.3 packs/day for 20.0 years (5.0 ttl pk-yrs)   Types: Cigarettes   Start date: 12/16/1994   Last attempt to quit: 12/16/2014   Years since quitting: 8.8  Smokeless Tobacco Never    Allergies  Allergen Reactions   Latex Itching   Citalopram     Other Reaction(s): Headache   Objective:  There were no vitals filed for this visit. There is no height or weight on file to calculate BMI. Constitutional Well developed. Well nourished.  Vascular Dorsalis pedis pulses palpable bilaterally. Posterior tibial pulses palpable bilaterally. Capillary refill normal to all digits.  No cyanosis or clubbing noted. Pedal  hair growth normal.  Neurologic Normal speech. Oriented to person, place, and time. Epicritic sensation to light touch grossly present bilaterally.  Dermatologic Nails well groomed and normal in appearance. No open wounds. No skin lesions.  Orthopedic: Normal joint ROM without pain or crepitus bilaterally. No visible deformities. Tender to palpation at the calcaneal tuber left. No pain with calcaneal squeeze left. Ankle ROM diminished range of motion left. Silfverskiold Test: positive left.   Radiographs: None  Assessment:   1. Plantar fasciitis, left   2. Gastrocnemius equinus of left lower extremity    Plan:  Patient was evaluated and treated and all questions answered.  Plantar Fasciitis, left with underlying gastrocnemius equinus - XR reviewed as above.  - Educated on icing and stretching. Instructions given.  - Injection delivered to the plantar fascia as below. - DME: Plantar fascial brace dispensed to support the medial longitudinal arch of the foot and offload pressure from the heel and prevent arch collapse during weightbearing - Pharmacologic management: None  Procedure: Injection Tendon/Ligament Location: Left plantar fascia at the glabrous junction; medial approach. Skin Prep: alcohol Injectate: 0.5 cc 0.5% marcaine plain, 0.5 cc of 1% Lidocaine, 0.5 cc kenalog 10. Disposition: Patient tolerated procedure well. Injection site dressed with a band-aid.  No follow-ups on file.

## 2023-11-03 ENCOUNTER — Other Ambulatory Visit: Payer: Self-pay | Admitting: Podiatry

## 2023-11-03 ENCOUNTER — Telehealth: Payer: Self-pay | Admitting: Podiatry

## 2023-11-03 MED ORDER — METHYLPREDNISOLONE 4 MG PO TBPK: ORAL_TABLET | ORAL | 0 refills | Status: AC

## 2023-11-03 MED ORDER — MELOXICAM 15 MG PO TABS: 15.0000 mg | ORAL_TABLET | Freq: Every day | ORAL | 0 refills | Status: AC

## 2023-11-03 NOTE — Telephone Encounter (Signed)
"  Patient received an injection in the left foot on 4/15 but continues to experience severe pain and is unable to walk. Shoe inserts have not provided relief. Please advise on additional options for pain management."  Would you like this to sound more formal or clinical?

## 2024-01-02 ENCOUNTER — Ambulatory Visit: Payer: MEDICAID | Admitting: Podiatry

## 2024-03-18 ENCOUNTER — Encounter: Payer: Self-pay | Admitting: Family Medicine

## 2024-03-18 DIAGNOSIS — Z1231 Encounter for screening mammogram for malignant neoplasm of breast: Secondary | ICD-10-CM

## 2024-03-19 ENCOUNTER — Ambulatory Visit: Payer: MEDICAID | Admitting: Podiatry

## 2024-03-19 DIAGNOSIS — D492 Neoplasm of unspecified behavior of bone, soft tissue, and skin: Secondary | ICD-10-CM | POA: Diagnosis not present

## 2024-03-19 NOTE — Progress Notes (Signed)
 Subjective:  Patient ID: Nancy Chase, female    DOB: August 25, 1960,  MRN: [id number]  Chief Complaint  Patient presents with   Skin neoplasm    63 y.o. female presents with the above complaint.  Patient presents with complaint of right dorsal midfoot pain.  Patient states causing her pain and swelling.  She went to get it evaluated.  She states that it is still manageable she does not want to injection.  She would like to discuss other treatment options.  Pain scale is 5 out of 10 dull aching nature noticeable when ambulating.  She does work on her feet.   Review of Systems: Negative except as noted in the HPI. Denies N/V/F/Ch.  No past medical history on file.  Current Outpatient Medications:    meloxicam  (MOBIC ) 15 MG tablet, Take 1 tablet (15 mg total) by mouth daily., Disp: 30 tablet, Rfl: 0   methylPREDNISolone  (MEDROL  DOSEPAK) 4 MG TBPK tablet, Take as directed, Disp: 21 each, Rfl: 0   acetaminophen (TYLENOL) 500 MG tablet, , Disp: , Rfl:    acyclovir (ZOVIRAX) 400 MG tablet, Take 1 tablet by mouth 3 (three) times daily., Disp: , Rfl:    acyclovir (ZOVIRAX) 800 MG tablet, Take 800 mg by mouth 3 (three) times daily., Disp: , Rfl:    amitriptyline (ELAVIL) 25 MG tablet, Take 1 tablet by mouth daily., Disp: , Rfl:    amLODipine-benazepril (LOTREL) 5-20 MG capsule, amlodipine 5 mg-benazepril 20 mg capsule  TAKE 2 CAPSULES BY MOUTH AT THE SAME TIME EVERY DAY, Disp: , Rfl:    amoxicillin-clavulanate (AUGMENTIN) 875-125 MG tablet, Take 1 tablet by mouth 2 (two) times daily., Disp: , Rfl:    aspirin 81 MG EC tablet, aspirin 81 mg tablet,delayed release  TAKE 1 TABLET BY MOUTH DAILY FOR HEART PROTECTION, Disp: , Rfl:    atorvastatin (LIPITOR) 40 MG tablet, TAKE 1 TABLET BY MOUTH AT BEDTIME FOR HIGH CHOLESTEROL, Disp: , Rfl:    benzonatate (TESSALON) 100 MG capsule, Take by mouth., Disp: , Rfl:    celecoxib (CELEBREX) 100 MG capsule, See admin instructions., Disp: , Rfl:    cephALEXin   (KEFLEX ) 500 MG capsule, TAKE 1 CAPSULE BY MOUTH 3 TIMES A DAY FOR 10 DAYS FOR INFECTION, Disp: , Rfl:    Cetirizine HCl 10 MG CAPS, Take by mouth., Disp: , Rfl:    CLARITIN-D 24 HOUR 10-240 MG 24 hr tablet, Take 1 tablet by mouth daily., Disp: , Rfl:    clobetasol cream (TEMOVATE) 0.05 %, APPLY TWICE DAILY TO FINGER, Disp: , Rfl:    cyclobenzaprine (FLEXERIL) 10 MG tablet, Take 10 mg by mouth every 8 (eight) hours as needed., Disp: , Rfl:    famotidine (PEPCID) 20 MG tablet, Take 20 mg by mouth 2 (two) times daily as needed., Disp: , Rfl:    fluconazole (DIFLUCAN) 150 MG tablet, TAKE 1 TABLET BYMOUTH AS A SINGLE DOSE FOR YEAST INFECTION. MAY REPEAT IN 3 DAYS IF SYMPTOMS PERSIST, Disp: , Rfl:    FLUoxetine (PROZAC) 20 MG capsule, TAKE 1 CAPSULE BY MOUTH DAILY FOR DEPRESSION, Disp: , Rfl:    Fluoxetine HCl, PMDD, 20 MG TABS, Take by mouth., Disp: , Rfl:    hydrochlorothiazide (MICROZIDE) 12.5 MG capsule, Take 12.5 mg by mouth daily., Disp: , Rfl:    isosorbide mononitrate (IMDUR) 30 MG 24 hr tablet, Take by mouth., Disp: , Rfl:    loratadine (CLARITIN) 10 MG tablet, Take 10 mg by mouth daily., Disp: , Rfl:  methylPREDNISolone  (MEDROL  DOSEPAK) 4 MG TBPK tablet, Take as directed, Disp: 21 each, Rfl: 0   mirabegron  ER (MYRBETRIQ ) 50 MG TB24 tablet, Take 1 tablet (50 mg total) by mouth daily., Disp: 30 tablet, Rfl: 11   naproxen (NAPROSYN) 500 MG tablet, naproxen 500 mg tablet  TAKE 1 TABLET BY MOUTH TWICE A DAY WITH MEALS, Disp: , Rfl:   Social History   Tobacco Use  Smoking Status Every Day   Current packs/day: 0.00   Average packs/day: 0.3 packs/day for 20.0 years (5.0 ttl pk-yrs)   Types: Cigarettes   Start date: 12/16/1994   Last attempt to quit: 12/16/2014   Years since quitting: 9.2  Smokeless Tobacco Never    Allergies  Allergen Reactions   Latex Itching   Citalopram     Other Reaction(s): Headache   Objective:  There were no vitals filed for this visit. There is no height or  weight on file to calculate BMI. Constitutional Well developed. Well nourished.  Vascular Dorsalis pedis pulses palpable bilaterally. Posterior tibial pulses palpable bilaterally. Capillary refill normal to all digits.  No cyanosis or clubbing noted. Pedal hair growth normal.  Neurologic Normal speech. Oriented to person, place, and time. Epicritic sensation to light touch grossly present bilaterally.  Dermatologic Nails well groomed and normal in appearance. No open wounds. No skin lesions.  Orthopedic: Pain on palpation of the right dorsal midfoot.  Clinically able to appreciate underlying arthritis.  Pain with range of motion of the tarsometatarsal joints 2 through 5.  Lisfranc interval is intact.  No extensor tendinitis noted   Radiographs: 3 views of skeletally mature the right foot: Mild midfoot arthritis noted.  Pes planovalgus foot deformity noted.  No other bony abnormalities noted on the x-ray Assessment:   No diagnosis found.  Plan:  Patient was evaluated and treated and all questions answered.  Right midfoot arthritis -All questions and concerns were discussed with the patient in extensive detail.  Ultimately I discussed with her she will benefit from a steroid injection however patient would like to hold off and discuss oral medication for now.  If there is no improvement we will discuss steroid injection under next medical visit.  I discussed shoe gear modification as well.  No follow-ups on file.

## 2024-03-27 ENCOUNTER — Other Ambulatory Visit: Payer: Self-pay

## 2024-03-27 ENCOUNTER — Emergency Department: Payer: MEDICAID

## 2024-03-27 ENCOUNTER — Emergency Department
Admission: EM | Admit: 2024-03-27 | Discharge: 2024-03-27 | Disposition: A | Discharge status: Home / Self Care | Payer: MEDICAID | Attending: Emergency Medicine | Admitting: Emergency Medicine

## 2024-03-27 DIAGNOSIS — R42 Dizziness and giddiness: Secondary | ICD-10-CM | POA: Insufficient documentation

## 2024-03-27 DIAGNOSIS — R519 Headache, unspecified: Secondary | ICD-10-CM | POA: Insufficient documentation

## 2024-03-27 LAB — CBC
HCT: 49.8 % — ABNORMAL HIGH (ref 36.0–46.0)
Hemoglobin: 16.5 g/dL — ABNORMAL HIGH (ref 12.0–15.0)
MCH: 31 pg (ref 26.0–34.0)
MCHC: 33.1 g/dL (ref 30.0–36.0)
MCV: 93.4 fL (ref 80.0–100.0)
Platelets: 261 K/uL (ref 150–400)
RBC: 5.33 MIL/uL — ABNORMAL HIGH (ref 3.87–5.11)
RDW: 13.9 % (ref 11.5–15.5)
WBC: 3.8 K/uL — ABNORMAL LOW (ref 4.0–10.5)
nRBC: 0 % (ref 0.0–0.2)

## 2024-03-27 LAB — URINALYSIS, COMPLETE (UACMP) WITH MICROSCOPIC
Bilirubin Urine: NEGATIVE
Glucose, UA: NEGATIVE mg/dL
Ketones, ur: NEGATIVE mg/dL
Leukocytes,Ua: NEGATIVE
Nitrite: NEGATIVE
Protein, ur: NEGATIVE mg/dL
Specific Gravity, Urine: 1.023 (ref 1.005–1.030)
pH: 5 (ref 5.0–8.0)

## 2024-03-27 LAB — BASIC METABOLIC PANEL WITH GFR
Anion gap: 9 (ref 5–15)
BUN: 28 mg/dL — ABNORMAL HIGH (ref 6–20)
CO2: 22 mmol/L (ref 22–32)
Calcium: 8.8 mg/dL — ABNORMAL LOW (ref 8.9–10.3)
Chloride: 108 mmol/L (ref 98–111)
Creatinine, Ser: 0.69 mg/dL (ref 0.44–1.00)
GFR, Estimated: >60 mL/min (ref >60)
Glucose, Bld: 95 mg/dL (ref 70–99)
Potassium: 3.7 mmol/L (ref 3.5–5.1)
Sodium: 139 mmol/L (ref 135–145)

## 2024-03-27 MED ORDER — BUTALBITAL-APAP-CAFFEINE 50-325-40 MG PO TABS: 1.0000 | ORAL_TABLET | Freq: Four times a day (QID) | ORAL | 0 refills | Status: AC | PRN

## 2024-03-27 MED ORDER — DIPHENHYDRAMINE HCL 50 MG/ML IJ SOLN
50.0000 mg | Freq: Once | INTRAMUSCULAR | Status: AC
  Administered 2024-03-27: 50 mg via INTRAVENOUS
  Filled 2024-03-27: qty 1

## 2024-03-27 MED ORDER — KETOROLAC TROMETHAMINE 30 MG/ML IJ SOLN
30.0000 mg | Freq: Once | INTRAMUSCULAR | Status: AC
  Administered 2024-03-27: 30 mg via INTRAVENOUS
  Filled 2024-03-27: qty 1

## 2024-03-27 MED ORDER — METOCLOPRAMIDE HCL 5 MG/ML IJ SOLN
10.0000 mg | Freq: Once | INTRAMUSCULAR | Status: AC
  Administered 2024-03-27: 10 mg via INTRAVENOUS
  Filled 2024-03-27: qty 2

## 2024-03-27 MED ORDER — SODIUM CHLORIDE 0.9 % IV BOLUS
1000.0000 mL | Freq: Once | INTRAVENOUS | Status: AC
  Administered 2024-03-27: 1000 mL via INTRAVENOUS

## 2024-03-27 NOTE — ED Provider Notes (Signed)
 [facility] Provider Note    Event Date/Time   First MD Initiated Contact with Patient 03/27/24 1705     (approximate)  History   Chief Complaint: Headache  HPI  Nancy Chase is a 63 y.o. female presents to the emergency department with complaints of a headache intermittent but ongoing for the past 1 week.  According to the patient for the past week she has had a pain to the back of her head mostly on the left side and occasionally in a wraparound to her neck.  Patient states she has been experiencing intermittent dizziness as well.  Patient denies any weakness or numbness of any arm or leg denies any confusion or slurred speech.  Denies any history of migraines.  Physical Exam   Triage Vital Signs: ED Triage Vitals [03/27/24 1605]  Encounter Vitals Group     BP (!) 121/104     Girls Systolic BP Percentile      Girls Diastolic BP Percentile      Boys Systolic BP Percentile      Boys Diastolic BP Percentile      Pulse Rate 65     Resp 16     Temp 98.2 F (36.8 C)     Temp Source Oral     SpO2 100 %     Weight      Height      Head Circumference      Peak Flow      Pain Score      Pain Loc      Pain Education      Exclude from Growth Chart     Most recent vital signs: Vitals:   03/27/24 1605  BP: (!) 121/104  Pulse: 65  Resp: 16  Temp: 98.2 F (36.8 C)  SpO2: 100%    General: Awake, no distress.  CV:  Good peripheral perfusion.  Regular rate and rhythm  Resp:  Normal effort.  Equal breath sounds bilaterally.  Abd:  No distention.   Other:  Normal left tympanic membrane.   ED Results / Procedures / Treatments   EKG  EKG viewed and interpreted by myself shows sinus bradycardia at 58 bpm with a narrow QRS, normal axis, normal intervals, nonspecific with no concerning ST changes.  RADIOLOGY  I have reviewed interpret the CT head images.  I do not appreciate any obvious bleed or significant abnormality on my  evaluation.   MEDICATIONS ORDERED IN ED: Medications - No data to display   IMPRESSION / MDM / ASSESSMENT AND PLAN / ED COURSE  I reviewed the triage vital signs and the nursing notes.  Patient's presentation is most consistent with acute presentation with potential threat to life or bodily function.  Patient presents the emergency department for headache intermittent dizziness headache has been ongoing for approximately 1 week per patient.  Overall the patient appears well, no distress, reassuring physical exam, reassuring vital signs.  Lab work shows overall reassuring CBC and no concerning findings on the chemistry.  EKG shows no concerning findings.  Given no history of migraines we will obtain a CT scan of the head as a precaution.  Will dose Toradol  Reglan  and Benadryl  and continue to closely monitor.  Patient's workup is overall reassuring with a reassuring CBC chemistry and urinalysis.  I have reviewed the CT images no obvious bleed or significant abnormality seen on my evaluation.  Awaiting radiology read.  As long as radiology agrees we will discharge the patient home  with a short course of Fioricet.  Patient states the headache is minimal after medication.  Discussed with the patient to try the Fioricet tomorrow should the headache return otherwise she will follow-up with her doctor.  Patient agreeable to plan.  FINAL CLINICAL IMPRESSION(S) / ED DIAGNOSES   Headache    Note:  This document was prepared using Dragon voice recognition software and may include unintentional dictation errors.   Dorothyann Drivers, MD 03/27/24 [phone]

## 2024-03-27 NOTE — Discharge Instructions (Signed)
 As we discussed please take your Fioricet tomorrow if you are continuing to have any headache or discomfort.  Please drink plenty of fluids.  Please follow-up with your doctor within the next several days for recheck/reevaluation.  Return to the emergency department for any symptom personally concerning to yourself.

## 2024-03-27 NOTE — ED Triage Notes (Signed)
 Pt reports intermittent HA, dizziness since Monday/Tues. Pt reports blurry vision but dx with cataracts. Intermittent L ear pain. GCS 15. PMH: HTN

## 2024-03-27 NOTE — ED Triage Notes (Signed)
 First nurse note: Pt to ED via [facility] from home. Pt reports HA x6-7 days. Pt reports posterior with radiation to left ear. Pt reports intermittent dizziness  145/94 99% RA 68 HR

## 2024-03-28 ENCOUNTER — Other Ambulatory Visit: Payer: Self-pay | Admitting: Family Medicine

## 2024-03-28 DIAGNOSIS — Z1231 Encounter for screening mammogram for malignant neoplasm of breast: Secondary | ICD-10-CM

## 2024-04-17 ENCOUNTER — Ambulatory Visit: Payer: MEDICAID | Admitting: Dermatology

## 2024-05-30 ENCOUNTER — Telehealth: Payer: Self-pay | Admitting: Podiatry

## 2024-05-30 NOTE — Telephone Encounter (Signed)
 What can patient do at home to ease heel pain? Is there any type of padding she can buy to put under the heel to relieve the pain?

## 2024-06-05 ENCOUNTER — Ambulatory Visit: Admitting: Podiatry

## 2024-06-20 ENCOUNTER — Ambulatory Visit: Admitting: Podiatry

## 2024-06-20 DIAGNOSIS — M216X2 Other acquired deformities of left foot: Secondary | ICD-10-CM

## 2024-06-20 DIAGNOSIS — M216X1 Other acquired deformities of right foot: Secondary | ICD-10-CM | POA: Diagnosis not present

## 2024-06-20 DIAGNOSIS — M722 Plantar fascial fibromatosis: Secondary | ICD-10-CM

## 2024-06-20 NOTE — Progress Notes (Signed)
 Subjective:  Patient ID: Nancy Chase, female    DOB: [birth year],  MRN: [id number]  Chief Complaint  Patient presents with   Foot Pain    63 y.o. female presents with the above complaint.  Presents for follow-up of bilateral plantar fasciitis she states that is starting to act back up again wanted get it evaluated hurts with ambulation and shoe pressure has not seen MRIs prior to seeing me denies any other acute complaints.  Review of Systems: Negative except as noted in the HPI. Denies N/V/F/Ch.  No past medical history on file.  Current Outpatient Medications:    meloxicam  (MOBIC ) 15 MG tablet, Take 1 tablet (15 mg total) by mouth daily., Disp: 30 tablet, Rfl: 0   methylPREDNISolone  (MEDROL  DOSEPAK) 4 MG TBPK tablet, Take as directed, Disp: 21 each, Rfl: 0   acetaminophen (TYLENOL) 500 MG tablet, , Disp: , Rfl:    acyclovir (ZOVIRAX) 400 MG tablet, Take 1 tablet by mouth 3 (three) times daily., Disp: , Rfl:    acyclovir (ZOVIRAX) 800 MG tablet, Take 800 mg by mouth 3 (three) times daily., Disp: , Rfl:    amitriptyline (ELAVIL) 25 MG tablet, Take 1 tablet by mouth daily., Disp: , Rfl:    amLODipine-benazepril (LOTREL) 5-20 MG capsule, amlodipine 5 mg-benazepril 20 mg capsule  TAKE 2 CAPSULES BY MOUTH AT THE SAME TIME EVERY DAY, Disp: , Rfl:    amoxicillin-clavulanate (AUGMENTIN) 875-125 MG tablet, Take 1 tablet by mouth 2 (two) times daily., Disp: , Rfl:    aspirin 81 MG EC tablet, aspirin 81 mg tablet,delayed release  TAKE 1 TABLET BY MOUTH DAILY FOR HEART PROTECTION, Disp: , Rfl:    atorvastatin (LIPITOR) 40 MG tablet, TAKE 1 TABLET BY MOUTH AT BEDTIME FOR HIGH CHOLESTEROL, Disp: , Rfl:    benzonatate (TESSALON) 100 MG capsule, Take by mouth., Disp: , Rfl:    butalbital -acetaminophen-caffeine  (FIORICET) 50-325-40 MG tablet, Take 1-2 tablets by mouth every 6 (six) hours as needed for headache., Disp: 20 tablet, Rfl: 0   celecoxib (CELEBREX) 100 MG capsule, See admin instructions.,  Disp: , Rfl:    cephALEXin  (KEFLEX ) 500 MG capsule, TAKE 1 CAPSULE BY MOUTH 3 TIMES A DAY FOR 10 DAYS FOR INFECTION, Disp: , Rfl:    Cetirizine HCl 10 MG CAPS, Take by mouth., Disp: , Rfl:    CLARITIN-D 24 HOUR 10-240 MG 24 hr tablet, Take 1 tablet by mouth daily., Disp: , Rfl:    clobetasol cream (TEMOVATE) 0.05 %, APPLY TWICE DAILY TO FINGER, Disp: , Rfl:    cyclobenzaprine (FLEXERIL) 10 MG tablet, Take 10 mg by mouth every 8 (eight) hours as needed., Disp: , Rfl:    famotidine (PEPCID) 20 MG tablet, Take 20 mg by mouth 2 (two) times daily as needed., Disp: , Rfl:    fluconazole (DIFLUCAN) 150 MG tablet, TAKE 1 TABLET BYMOUTH AS A SINGLE DOSE FOR YEAST INFECTION. MAY REPEAT IN 3 DAYS IF SYMPTOMS PERSIST, Disp: , Rfl:    FLUoxetine (PROZAC) 20 MG capsule, TAKE 1 CAPSULE BY MOUTH DAILY FOR DEPRESSION, Disp: , Rfl:    Fluoxetine HCl, PMDD, 20 MG TABS, Take by mouth., Disp: , Rfl:    hydrochlorothiazide (MICROZIDE) 12.5 MG capsule, Take 12.5 mg by mouth daily., Disp: , Rfl:    isosorbide mononitrate (IMDUR) 30 MG 24 hr tablet, Take by mouth., Disp: , Rfl:    loratadine (CLARITIN) 10 MG tablet, Take 10 mg by mouth daily., Disp: , Rfl:    methylPREDNISolone  (MEDROL  DOSEPAK) 4  MG TBPK tablet, Take as directed, Disp: 21 each, Rfl: 0   mirabegron  ER (MYRBETRIQ ) 50 MG TB24 tablet, Take 1 tablet (50 mg total) by mouth daily., Disp: 30 tablet, Rfl: 11   naproxen (NAPROSYN) 500 MG tablet, naproxen 500 mg tablet  TAKE 1 TABLET BY MOUTH TWICE A DAY WITH MEALS, Disp: , Rfl:   Social History   Tobacco Use  Smoking Status Every Day   Current packs/day: 0.00   Average packs/day: 0.3 packs/day for 20.0 years (5.0 ttl pk-yrs)   Types: Cigarettes   Start date: 12/16/1994   Last attempt to quit: 12/16/2014   Years since quitting: 9.5  Smokeless Tobacco Never    Allergies  Allergen Reactions   Latex Itching   Citalopram     Other Reaction(s): Headache   Objective:  There were no vitals filed for this  visit. There is no height or weight on file to calculate BMI. Constitutional Well developed. Well nourished.  Vascular Dorsalis pedis pulses palpable bilaterally. Posterior tibial pulses palpable bilaterally. Capillary refill normal to all digits.  No cyanosis or clubbing noted. Pedal hair growth normal.  Neurologic Normal speech. Oriented to person, place, and time. Epicritic sensation to light touch grossly present bilaterally.  Dermatologic Nails well groomed and normal in appearance. No open wounds. No skin lesions.  Orthopedic: Normal joint ROM without pain or crepitus bilaterally. No visible deformities. Tender to palpation at the calcaneal tuber bilateral No pain with calcaneal squeeze bilateral Ankle ROM diminished range of motion bilateral Silfvers[place]old Test: positive left.   Radiographs: None  Assessment:   1. Plantar fasciitis, left   2. Plantar fasciitis of right foot   3. Other acquired deformities of right foot   4. Other acquired deformities of left foot     Plan:  Patient was evaluated and treated and all questions answered.  Plantar Fasciitis, bilateral with underlying gastrocnemius equinus - XR reviewed as above.  - Educated on icing and stretching. Instructions given.  - Injection delivered to the plantar fascia as below. - DME: Plantar fascial brace dispensed to support the medial longitudinal arch of the foot and offload pressure from the heel and prevent arch collapse during weightbearing - Pharmacologic management: None  Pes planovalgus/foot deformity -I explained to patient the etiology of pes planovalgus and relationship with heel pain/arch pain and various treatment options were discussed.  Given patient foot structure in the setting of heel pain/arch pain I believe patient will benefit from custom-made orthotics to help control the hindfoot motion support the arch of the foot and take the stress away from arches.  Patient agrees with the plan like  to proceed with orthotics -Patient was casted for orthotics   Procedure: Injection Tendon/Ligament Location: Left plantar fascia at the glabrous junction; medial approach. Skin Prep: alcohol Injectate: 0.5 cc 0.5% marcaine plain, 0.5 cc of 1% Lidocaine , 0.5 cc kenalog 10. Disposition: Patient tolerated procedure well. Injection site dressed with a band-aid.  No follow-ups on file.

## 2024-07-10 ENCOUNTER — Telehealth: Payer: Self-pay | Admitting: Podiatry

## 2024-07-10 NOTE — Telephone Encounter (Signed)
 Orthotics are in. I spoke to Albin and she is scheduled to pick up in [place] on 07/19/2024. Sending orthotics to [place] with Dr. Tobie.

## 2024-07-18 ENCOUNTER — Ambulatory Visit (INDEPENDENT_AMBULATORY_CARE_PROVIDER_SITE_OTHER): Admitting: Podiatry

## 2024-07-18 DIAGNOSIS — M722 Plantar fascial fibromatosis: Secondary | ICD-10-CM

## 2024-07-18 NOTE — Progress Notes (Signed)
 Orthotics are dispensed are functioning well no acute complaints.  If any foot and ankle issues arise future come back and see me.  Break-in period was discussed

## 2024-07-25 ENCOUNTER — Ambulatory Visit
Admission: RE | Admit: 2024-07-25 | Discharge: 2024-07-25 | Disposition: A | Discharge status: Home / Self Care | Payer: Self-pay | Source: Ambulatory Visit | Attending: Family Medicine | Admitting: Family Medicine

## 2024-07-25 DIAGNOSIS — Z1231 Encounter for screening mammogram for malignant neoplasm of breast: Secondary | ICD-10-CM | POA: Diagnosis present

## 2024-08-07 ENCOUNTER — Other Ambulatory Visit: Payer: Self-pay | Admitting: Neurology

## 2024-08-07 DIAGNOSIS — M7918 Myalgia, other site: Secondary | ICD-10-CM

## 2024-08-07 DIAGNOSIS — R519 Headache, unspecified: Secondary | ICD-10-CM

## 2024-08-12 ENCOUNTER — Ambulatory Visit

## 2024-08-16 ENCOUNTER — Inpatient Hospital Stay: Admission: RE | Admit: 2024-08-16

## 2024-08-19 ENCOUNTER — Ambulatory Visit

## 2024-08-21 ENCOUNTER — Ambulatory Visit

## 2024-08-26 ENCOUNTER — Ambulatory Visit

## 2024-08-28 ENCOUNTER — Ambulatory Visit

## 2024-09-02 ENCOUNTER — Ambulatory Visit

## 2024-09-04 ENCOUNTER — Ambulatory Visit

## 2024-09-09 ENCOUNTER — Ambulatory Visit

## 2024-09-11 ENCOUNTER — Ambulatory Visit

## 2024-09-16 ENCOUNTER — Ambulatory Visit

## 2024-09-18 ENCOUNTER — Ambulatory Visit

## 2024-09-23 ENCOUNTER — Ambulatory Visit

## 2024-09-25 ENCOUNTER — Ambulatory Visit

## 2024-09-30 ENCOUNTER — Ambulatory Visit

## 2024-10-02 ENCOUNTER — Ambulatory Visit

## 2024-10-07 ENCOUNTER — Ambulatory Visit

## 2024-10-09 ENCOUNTER — Ambulatory Visit

## 2024-10-14 ENCOUNTER — Ambulatory Visit

## 2024-10-16 ENCOUNTER — Ambulatory Visit

## 2024-10-21 ENCOUNTER — Ambulatory Visit

## 2024-10-23 ENCOUNTER — Ambulatory Visit

## 2024-10-28 ENCOUNTER — Ambulatory Visit

## 2024-10-30 ENCOUNTER — Ambulatory Visit

## 2024-11-04 ENCOUNTER — Ambulatory Visit

## 2024-11-06 ENCOUNTER — Ambulatory Visit

## 2024-11-11 ENCOUNTER — Ambulatory Visit
